# Patient Record
Sex: Female | Born: 2000 | Race: Black or African American | Hispanic: No | Marital: Single | Smoking: Current every day smoker
Health system: Southern US, Community
[De-identification: ages and names within clinical notes are randomized; demographics above are authoritative.]

## PROBLEM LIST (undated history)

## (undated) DIAGNOSIS — M238X9 Other internal derangements of unspecified knee: Secondary | ICD-10-CM

## (undated) DIAGNOSIS — R109 Unspecified abdominal pain: Secondary | ICD-10-CM

## (undated) HISTORY — DX: Unspecified abdominal pain: R10.9

## (undated) HISTORY — DX: Other internal derangements of unspecified knee: M23.8X9

---
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
## 2021-02-23 ENCOUNTER — Other Ambulatory Visit: Payer: Self-pay

## 2021-02-23 ENCOUNTER — Ambulatory Visit (HOSPITAL_COMMUNITY)
Admission: EM | Admit: 2021-02-23 | Discharge: 2021-02-23 | Disposition: A | Discharge status: Home / Self Care | Payer: Medicaid Other | Attending: Emergency Medicine | Admitting: Emergency Medicine

## 2021-02-23 ENCOUNTER — Encounter (HOSPITAL_COMMUNITY): Payer: Self-pay | Admitting: *Deleted

## 2021-02-23 DIAGNOSIS — B372 Candidiasis of skin and nail: Secondary | ICD-10-CM

## 2021-02-23 DIAGNOSIS — R198 Other specified symptoms and signs involving the digestive system and abdomen: Secondary | ICD-10-CM | POA: Diagnosis not present

## 2021-02-23 MED ORDER — NYSTATIN 100000 UNIT/GM EX CREA: TOPICAL_CREAM | CUTANEOUS | 0 refills | Status: DC

## 2021-02-23 NOTE — Discharge Instructions (Signed)
Use the nystatin cream in your bellybutton for the next few days.   Try to keep bellybutton clean and dry.  Avoid tight fitting clothing.  Follow-up with primary care or go to the Emergency Department if symptoms worsen or do not improve in the next few days.

## 2021-02-23 NOTE — ED Provider Notes (Signed)
[facility]    CSN: [id number] Arrival date & time: 02/23/21  1337      History   Chief Complaint Chief Complaint  Patient presents with  . Post-op Problem    HPI Kari Neal is a 20 y.o. female.   Patient here for evaluation of drainage from umbilicus.  Reports having gallbladder removal in February 2020.  Noticed some drainage from umbilicus yesterday and this morning.  Denies any pain.  No redness or swelling around umbilicus.  Surgical scar noted at umbilicus with good healing no signs of infection. Denies any specific alleviating or aggravating factors.  Denies any fevers, chest pain, shortness of breath, N/V/D, numbness, tingling, weakness, abdominal pain, or headaches.   ROS: As per HPI, all other pertinent ROS negative   The history is provided by the patient.    Past Medical History:  Diagnosis Date  . Abdominal pain, recurrent   . Knee crepitus     Patient Active Problem List   Diagnosis Date Noted  . Abdominal pain, recurrent     Past Surgical History:  Procedure Laterality Date  . CHOLECYSTECTOMY N/A 01/13/2019   Procedure: LAPAROSCOPIC CHOLECYSTECTOMY WITH INTRAOPERATIVE CHOLANGIOGRAM ERAS PATHWAY;  Surgeon: Ovidio Kin, MD;  Location: [facility] OR;  Service: General;  Laterality: N/A;    OB History   No obstetric history on file.      Home Medications    Prior to Admission medications   Medication Sig Start Date End Date Taking? Authorizing Provider  nystatin cream (MYCOSTATIN) Apply to affected area 2 times daily 02/23/21  Yes Ivette Loyal, NP  acetaminophen (MIDOL) 650 MG CR tablet Take 650 mg by mouth every 8 (eight) hours as needed for pain.    [provider]  baclofen (LIORESAL) 10 MG tablet Take half tablet (5mg ) at bedtime.  Call the office with update in 1-2 weeks. 06/27/20   Patel, 08/27/20 K, DO  ibuprofen (ADVIL) 600 MG tablet Take 1 tablet (600 mg total) by mouth every 6 (six) hours as needed. 09/18/20   09/20/20,  MD  ibuprofen (ADVIL,MOTRIN) 200 MG tablet Take 400 mg by mouth every 6 (six) hours as needed for moderate pain.     [provider]  mupirocin cream (BACTROBAN) 2 % Apply 1 application topically 2 (two) times daily. Patient not taking: Reported on 06/25/2020 11/01/19   14/8/20, NP    Family History Family History  Problem Relation Age of Onset  . Anxiety disorder Mother   . Miscarriages / Janace Aris Mother   . Diabetes Paternal Aunt   . Cancer Maternal Grandmother   . Breast cancer Maternal Grandmother   . Hypertension Maternal Grandmother   . Miscarriages / Stillbirths Maternal Grandmother   . Diabetes Maternal Grandfather   . Arthritis Paternal Grandmother     Social History Social History   Tobacco Use  . Smoking status: Current Every Day Smoker    Types: E-cigarettes    Start date: 04/26/2019  . Smokeless tobacco: Never Used  Vaping Use  . Vaping Use: Never used  Substance Use Topics  . Alcohol use: Yes  . Drug use: No     Allergies   Shellfish allergy   Review of Systems Review of Systems   Physical Exam Triage Vital Signs ED Triage Vitals  Enc Vitals Group     BP 02/23/21 1432 129/70     Pulse Rate 02/23/21 1432 75     Resp 02/23/21 1432 16  Temp 02/23/21 1432 98.5 F (36.9 C)     Temp Source 02/23/21 1432 Oral     SpO2 02/23/21 1432 100 %     Weight --      Height --      Head Circumference --      Peak Flow --      Pain Score 02/23/21 1433 3     Pain Loc --      Pain Edu? --      Excl. in GC? --    No data found.  Updated Vital Signs BP 129/70 (BP Location: Right Arm)   Pulse 75   Temp 98.5 F (36.9 C) (Oral)   Resp 16   LMP 02/09/2021   SpO2 100%   Visual Acuity Right Eye Distance:   Left Eye Distance:   Bilateral Distance:    Right Eye Near:   Left Eye Near:    Bilateral Near:     Physical Exam Vitals and nursing note reviewed.  Constitutional:      General: She is not in acute distress.    Appearance:  Normal appearance. She is not ill-appearing, toxic-appearing or diaphoretic.  HENT:     Head: Normocephalic and atraumatic.  Eyes:     Conjunctiva/sclera: Conjunctivae normal.  Cardiovascular:     Rate and Rhythm: Normal rate.     Pulses: Normal pulses.  Pulmonary:     Effort: Pulmonary effort is normal.  Abdominal:     General: Abdomen is flat. A surgical scar is present. There is no distension. There are no signs of injury.     Tenderness: There is no abdominal tenderness.     Comments: Surgical scar in umbilicus from gallbladder surgery in 2020, no signs or symptoms of infection  Musculoskeletal:        General: Normal range of motion.     Cervical back: Normal range of motion.  Skin:    General: Skin is warm and dry.     Findings: Rash present. Rash is scaling.     Comments: Flaking and scaly rash in umbilicus, most likely yeast  Neurological:     General: No focal deficit present.     Mental Status: She is alert and oriented to person, place, and time.  Psychiatric:        Mood and Affect: Mood normal.      UC Treatments / Results  Labs (all labs ordered are listed, but only abnormal results are displayed) Labs Reviewed - No data to display  EKG   Radiology No results found.  Procedures Procedures (including critical care time)  Medications Ordered in UC Medications - No data to display  Initial Impression / Assessment and Plan / UC Course  I have reviewed the triage vital signs and the nursing notes.  Pertinent labs & imaging results that were available during my care of the patient were reviewed by me and considered in my medical decision making (see chart for details).     Yeast dermatitis, umbilicus discharge Assessment negative for red flags or concerns including surgical infection Nystatin cream to umbilicus prescribed Keep umbilicus clean and dry and avoid tight fitting clothing. Follow-up if symptoms do not improve over the next few  days. Follow-up with primary care  Final Clinical Impressions(s) / UC Diagnoses   Final diagnoses:  Yeast dermatitis  Umbilicus discharge     Discharge Instructions     Use the nystatin cream in your bellybutton for the next few days.   Try  to keep bellybutton clean and dry.  Avoid tight fitting clothing.  Follow-up with primary care or go to the Emergency Department if symptoms worsen or do not improve in the next few days.      ED Prescriptions    Medication Sig Dispense Auth. Provider   nystatin cream (MYCOSTATIN) Apply to affected area 2 times daily 30 g Ivette Loyal, NP     PDMP not reviewed this encounter.   Ivette Loyal, NP 02/23/21 1513

## 2021-02-23 NOTE — ED Triage Notes (Signed)
Pt reports gallbladder removed in February 2022 . The drainage from incision site started yesterday. Site of drainage located in belly button site. Area appeared dry and intact while Pt was in triage room.

## 2021-03-28 ENCOUNTER — Encounter (INDEPENDENT_AMBULATORY_CARE_PROVIDER_SITE_OTHER): Payer: Self-pay

## 2021-04-02 ENCOUNTER — Inpatient Hospital Stay (HOSPITAL_COMMUNITY)
Admission: AD | Admit: 2021-04-02 | Discharge: 2021-04-02 | Disposition: A | Discharge status: Home / Self Care | Payer: Medicaid Other | Attending: Obstetrics and Gynecology | Admitting: Obstetrics and Gynecology

## 2021-04-02 ENCOUNTER — Other Ambulatory Visit: Payer: Self-pay

## 2021-04-02 ENCOUNTER — Encounter (HOSPITAL_COMMUNITY): Payer: Self-pay | Admitting: Obstetrics and Gynecology

## 2021-04-02 ENCOUNTER — Inpatient Hospital Stay (HOSPITAL_COMMUNITY): Payer: Medicaid Other

## 2021-04-02 DIAGNOSIS — Z3A01 Less than 8 weeks gestation of pregnancy: Secondary | ICD-10-CM | POA: Insufficient documentation

## 2021-04-02 DIAGNOSIS — O219 Vomiting of pregnancy, unspecified: Secondary | ICD-10-CM | POA: Diagnosis not present

## 2021-04-02 DIAGNOSIS — F17210 Nicotine dependence, cigarettes, uncomplicated: Secondary | ICD-10-CM | POA: Insufficient documentation

## 2021-04-02 DIAGNOSIS — N8312 Corpus luteum cyst of left ovary: Secondary | ICD-10-CM | POA: Diagnosis not present

## 2021-04-02 DIAGNOSIS — O26891 Other specified pregnancy related conditions, first trimester: Secondary | ICD-10-CM | POA: Insufficient documentation

## 2021-04-02 DIAGNOSIS — Z3490 Encounter for supervision of normal pregnancy, unspecified, unspecified trimester: Secondary | ICD-10-CM

## 2021-04-02 DIAGNOSIS — R1010 Upper abdominal pain, unspecified: Secondary | ICD-10-CM | POA: Insufficient documentation

## 2021-04-02 DIAGNOSIS — R109 Unspecified abdominal pain: Secondary | ICD-10-CM

## 2021-04-02 DIAGNOSIS — O99331 Smoking (tobacco) complicating pregnancy, first trimester: Secondary | ICD-10-CM | POA: Diagnosis not present

## 2021-04-02 DIAGNOSIS — O26899 Other specified pregnancy related conditions, unspecified trimester: Secondary | ICD-10-CM

## 2021-04-02 LAB — WET PREP, GENITAL
Clue Cells Wet Prep HPF POC: NONE SEEN
Sperm: NONE SEEN
Trich, Wet Prep: NONE SEEN
Yeast Wet Prep HPF POC: NONE SEEN

## 2021-04-02 LAB — CBC
HCT: 29.1 % — ABNORMAL LOW (ref 36.0–46.0)
Hemoglobin: 10.7 g/dL — ABNORMAL LOW (ref 12.0–15.0)
MCH: 33.2 pg (ref 26.0–34.0)
MCHC: 36.8 g/dL — ABNORMAL HIGH (ref 30.0–36.0)
MCV: 90.4 fL (ref 80.0–100.0)
Platelets: 320 10*3/uL (ref 150–400)
RBC: 3.22 MIL/uL — ABNORMAL LOW (ref 3.87–5.11)
RDW: 16.3 % — ABNORMAL HIGH (ref 11.5–15.5)
WBC: 14.5 10*3/uL — ABNORMAL HIGH (ref 4.0–10.5)
nRBC: 0 % (ref 0.0–0.2)

## 2021-04-02 LAB — URINALYSIS, ROUTINE W REFLEX MICROSCOPIC
Bilirubin Urine: NEGATIVE
Glucose, UA: NEGATIVE mg/dL
Hgb urine dipstick: NEGATIVE
Ketones, ur: 20 mg/dL — AB
Nitrite: NEGATIVE
Protein, ur: NEGATIVE mg/dL
Specific Gravity, Urine: 1.016 (ref 1.005–1.030)
pH: 7 (ref 5.0–8.0)

## 2021-04-02 LAB — COMPREHENSIVE METABOLIC PANEL
ALT: 12 U/L (ref 0–44)
AST: 18 U/L (ref 15–41)
Albumin: 4.1 g/dL (ref 3.5–5.0)
Alkaline Phosphatase: 28 U/L — ABNORMAL LOW (ref 38–126)
Anion gap: 8 (ref 5–15)
BUN: <5 mg/dL — ABNORMAL LOW (ref 6–20)
CO2: 22 mmol/L (ref 22–32)
Calcium: 8.9 mg/dL (ref 8.9–10.3)
Chloride: 103 mmol/L (ref 98–111)
Creatinine, Ser: 0.62 mg/dL (ref 0.44–1.00)
GFR, Estimated: >60 mL/min (ref >60)
Glucose, Bld: 78 mg/dL (ref 70–99)
Potassium: 3.7 mmol/L (ref 3.5–5.1)
Sodium: 133 mmol/L — ABNORMAL LOW (ref 135–145)
Total Bilirubin: 3.2 mg/dL — ABNORMAL HIGH (ref 0.3–1.2)
Total Protein: 6.8 g/dL (ref 6.5–8.1)

## 2021-04-02 LAB — ABO/RH: ABO/RH(D): O POS

## 2021-04-02 LAB — POCT PREGNANCY, URINE: Preg Test, Ur: POSITIVE — AB

## 2021-04-02 LAB — HCG, QUANTITATIVE, PREGNANCY: hCG, Beta Chain, Quant, S: 103941 m[IU]/mL — ABNORMAL HIGH (ref <5)

## 2021-04-02 MED ORDER — DOXYLAMINE-PYRIDOXINE 10-10 MG PO TBEC: 2.0000 | DELAYED_RELEASE_TABLET | Freq: Every day | ORAL | 2 refills | Status: DC

## 2021-04-02 NOTE — MAU Note (Signed)
Donia Ast NP in Family Rm to discuss test results and d/c plan with pt. Written and verbal d/c instructions given and understanding voiced.

## 2021-04-02 NOTE — MAU Provider Note (Signed)
History     CSN: [id number]  Arrival date and time: 04/02/21 1526   None     Chief Complaint  Patient presents with  . Abdominal Pain  . Emesis  . Nausea   [name] is a 20 y.o. G1P0 at 103w2d who presents to MAU for upper abdominal pain. Patient reports the pain began this afternoon and is intermittent and not present at this time. Patient identifies epigastric region as site of pain. Patient also endorses 2 episodes of vomiting prior to coming to MAU.  Pt denies VB, LOF, ctx, decreased FM, vaginal discharge/odor/itching. Pt denies constipation, diarrhea, or urinary problems. Pt denies fever, chills, fatigue, sweating or changes in appetite. Pt denies SOB or chest pain. Pt denies dizziness, HA, light-headedness, weakness.  Problems this pregnancy include: pt has not yet been seen. Allergies? Shellfish Current medications/supplements? none Prenatal care provider? [facility], [facility] appt 04/15/2021   OB History    Gravida  1   Para      Term      Preterm      AB      Living        SAB      IAB      Ectopic      Multiple      Live Births              Past Medical History:  Diagnosis Date  . Abdominal pain, recurrent   . Knee crepitus     Past Surgical History:  Procedure Laterality Date  . CHOLECYSTECTOMY N/A 01/13/2019   Procedure: LAPAROSCOPIC CHOLECYSTECTOMY WITH INTRAOPERATIVE CHOLANGIOGRAM ERAS PATHWAY;  Surgeon: Ovidio Kin, MD;  [facility];  Service: General;  Laterality: N/A;    Family History  Problem Relation Age of Onset  . Anxiety disorder Mother   . Miscarriages / [place] Mother   . Diabetes Paternal Aunt   . Cancer Maternal Grandmother   . Breast cancer Maternal Grandmother   . Hypertension Maternal Grandmother   . Miscarriages / Stillbirths Maternal Grandmother   . Diabetes Maternal Grandfather   . Arthritis Paternal Grandmother     Social History   Tobacco Use  . Smoking status: Current Every  Day Smoker    Types: E-cigarettes    Start date: 04/26/2019  . Smokeless tobacco: Never Used  Vaping Use  . Vaping Use: Never used  Substance Use Topics  . Alcohol use: Yes  . Drug use: No    Allergies:  Allergies  Allergen Reactions  . Shellfish Allergy Hives    Medications Prior to Admission  Medication Sig Dispense Refill Last Dose  . acetaminophen (MIDOL) 650 MG CR tablet Take 650 mg by mouth every 8 (eight) hours as needed for pain.     . baclofen (LIORESAL) 10 MG tablet Take half tablet (5mg ) at bedtime.  Call the office with update in 1-2 weeks. 30 each 0   . ibuprofen (ADVIL) 600 MG tablet Take 1 tablet (600 mg total) by mouth every 6 (six) hours as needed. 30 tablet 0   . ibuprofen (ADVIL,MOTRIN) 200 MG tablet Take 400 mg by mouth every 6 (six) hours as needed for moderate pain.      . mupirocin cream (BACTROBAN) 2 % Apply 1 application topically 2 (two) times daily. (Patient not taking: Reported on 06/25/2020) 15 g 0   . nystatin cream (MYCOSTATIN) Apply to affected area 2 times daily 30 g 0     Review of Systems  Constitutional: Negative for chills, diaphoresis, fatigue and fever.  Eyes: Negative for visual disturbance.  Respiratory: Negative for shortness of breath.   Cardiovascular: Negative for chest pain.  Gastrointestinal: Positive for abdominal pain, nausea and vomiting. Negative for constipation and diarrhea.  Genitourinary: Negative for dysuria, flank pain, frequency, pelvic pain, urgency, vaginal bleeding and vaginal discharge.  Neurological: Negative for dizziness, weakness, light-headedness and headaches.   Physical Exam   Blood pressure 121/68, pulse 86, temperature 98.3 F (36.8 C), temperature source Oral, resp. rate 18, height 5' (1.524 m), weight 83.1 kg, last menstrual period 02/10/2021, SpO2 100 %.  Patient Vitals for the past 24 hrs:  BP Temp Temp src Pulse Resp SpO2 Height Weight  04/02/21 1555 121/68 98.3 F (36.8 C) Oral 86 18 100 % -- --   04/02/21 1552 -- -- -- -- -- -- 5' (1.524 m) 83.1 kg   Physical Exam Vitals and nursing note reviewed.  Constitutional:      General: She is not in acute distress.    Appearance: Normal appearance. She is not ill-appearing, toxic-appearing or diaphoretic.  HENT:     Head: Normocephalic and atraumatic.  Pulmonary:     Effort: Pulmonary effort is normal.  Neurological:     Mental Status: She is alert and oriented to person, place, and time.  Psychiatric:        Mood and Affect: Mood normal.        Behavior: Behavior normal.        Thought Content: Thought content normal.        Judgment: Judgment normal.    Results for orders placed or performed during the hospital encounter of 04/02/21 (from the past 24 hour(s))  Urinalysis, Routine w reflex microscopic Urine, Clean Catch     Status: Abnormal   Collection Time: 04/02/21  4:04 PM  Result Value Ref Range   Color, Urine YELLOW YELLOW   APPearance CLEAR CLEAR   Specific Gravity, Urine 1.016 1.005 - 1.030   pH 7.0 5.0 - 8.0   Glucose, UA NEGATIVE NEGATIVE mg/dL   Hgb urine dipstick NEGATIVE NEGATIVE   Bilirubin Urine NEGATIVE NEGATIVE   Ketones, ur 20 (A) NEGATIVE mg/dL   Protein, ur NEGATIVE NEGATIVE mg/dL   Nitrite NEGATIVE NEGATIVE   Leukocytes,Ua SMALL (A) NEGATIVE   RBC / HPF 0-5 0 - 5 RBC/hpf   WBC, UA 0-5 0 - 5 WBC/hpf   Bacteria, UA RARE (A) NONE SEEN   Squamous Epithelial / LPF 0-5 0 - 5   Mucus PRESENT   Pregnancy, urine POC     Status: Abnormal   Collection Time: 04/02/21  5:39 PM  Result Value Ref Range   Preg Test, Ur POSITIVE (A) NEGATIVE  Wet prep, genital     Status: Abnormal   Collection Time: 04/02/21  5:54 PM   Specimen: PATH Cytology Cervicovaginal Ancillary Only  Result Value Ref Range   Yeast Wet Prep HPF POC NONE SEEN NONE SEEN   Trich, Wet Prep NONE SEEN NONE SEEN   Clue Cells Wet Prep HPF POC NONE SEEN NONE SEEN   WBC, Wet Prep HPF POC MANY (A) NONE SEEN   Sperm NONE SEEN   CBC     Status:  Abnormal   Collection Time: 04/02/21  6:00 PM  Result Value Ref Range   WBC 14.5 (H) 4.0 - 10.5 K/uL   RBC 3.22 (L) 3.87 - 5.11 MIL/uL   Hemoglobin 10.7 (L) 12.0 - 15.0 g/dL   HCT 53.6 (L)  36.0 - 46.0 %   MCV 90.4 80.0 - 100.0 fL   MCH 33.2 26.0 - 34.0 pg   MCHC 36.8 (H) 30.0 - 36.0 g/dL   RDW 23.5 (H) 57.3 - 22.0 %   Platelets 320 150 - 400 K/uL   nRBC 0.0 0.0 - 0.2 %  Comprehensive metabolic panel     Status: Abnormal   Collection Time: 04/02/21  6:00 PM  Result Value Ref Range   Sodium 133 (L) 135 - 145 mmol/L   Potassium 3.7 3.5 - 5.1 mmol/L   Chloride 103 98 - 111 mmol/L   CO2 22 22 - 32 mmol/L   Glucose, Bld 78 70 - 99 mg/dL   BUN <5 (L) 6 - 20 mg/dL   Creatinine, Ser 2.54 0.44 - 1.00 mg/dL   Calcium 8.9 8.9 - 27.0 mg/dL   Total Protein 6.8 6.5 - 8.1 g/dL   Albumin 4.1 3.5 - 5.0 g/dL   AST 18 15 - 41 U/L   ALT 12 0 - 44 U/L   Alkaline Phosphatase 28 (L) 38 - 126 U/L   Total Bilirubin 3.2 (H) 0.3 - 1.2 mg/dL   GFR, Estimated >62 >37 mL/min   Anion gap 8 5 - 15  hCG, quantitative, pregnancy     Status: Abnormal   Collection Time: 04/02/21  6:00 PM  Result Value Ref Range   hCG, Beta Chain, Quant, S 103,941 (H) <5 mIU/mL  ABO/Rh     Status: None   Collection Time: 04/02/21  6:00 PM  Result Value Ref Range   ABO/RH(D) O POS    No rh immune globuloin      NOT A RH IMMUNE GLOBULIN CANDIDATE, PT RH POSITIVE Performed at [facility], [address] [place], [place] [address]    US OB LESS THAN 14 WEEKS WITH OB TRANSVAGINAL  Result Date: 04/02/2021 CLINICAL DATA:  Initial evaluation for acute abdominal pain, early pregnancy. EXAM: OBSTETRIC <14 WK [place] AND TRANSVAGINAL OB US TECHNIQUE: Both transabdominal and transvaginal ultrasound examinations were performed for complete evaluation of the gestation as well as the maternal uterus, adnexal regions, and pelvic cul-de-sac. Transvaginal technique was performed to assess early pregnancy. COMPARISON:  None. FINDINGS:  Intrauterine gestational sac: Single Yolk sac:  Present Embryo:  Present Cardiac Activity: Present Heart Rate: 159 bpm CRL: 13.5 mm   7 w   4 d                  [place] EDC: 11/15/2021 Subchorionic hemorrhage:  None visualized. Maternal uterus/adnexae: Ovaries within normal limits. Degenerating corpus luteal cyst noted on the left. Associated small volume free fluid. No adnexal mass. IMPRESSION: 1. Single viable intrauterine pregnancy as above, estimated gestational age [redacted] weeks and 4 days by crown-rump length, with ultrasound EDC of 11/15/2021. No complication. 2. Degenerating left ovarian corpus luteal cyst with associated small volume free fluid within the pelvis. 3. No other acute maternal uterine or adnexal abnormality. Electronically Signed   By: Rise Mu M.D.   On: 04/02/2021 20:18    MAU Course  Procedures  MDM -r/o ectopic -UA: 20ketones/sm leuks/rare bacteria, sending urine for culture -CBC: no abnormalities requiring treatment -CMP: bilirubin 3.2 -[place]: single IUP, +embryo, FHR 159, [redacted]w[redacted]d -hCG: 517,616 -ABO: O Positive -WetPrep: WNL -GC/CT collected -pt discharged to home in stable condition  Orders Placed This Encounter  Procedures  . Wet prep, genital    Standing Status:   Standing    Number of Occurrences:  1  . Culture, OB Urine    Standing Status:   Standing    Number of Occurrences:   1  . US OB LESS THAN 14 WEEKS WITH OB TRANSVAGINAL    Standing Status:   Standing    Number of Occurrences:   1    Order Specific Question:   Symptom/Reason for Exam    Answer:   Abdominal pain in pregnancy [[id number]]  . Urinalysis, Routine w reflex microscopic Urine, Clean Catch    Standing Status:   Standing    Number of Occurrences:   1  . CBC    Standing Status:   Standing    Number of Occurrences:   1  . Comprehensive metabolic panel    Standing Status:   Standing    Number of Occurrences:   1  . hCG, quantitative, pregnancy    Standing Status:   Standing    Number of  Occurrences:   1  . Pregnancy, urine POC    Standing Status:   Standing    Number of Occurrences:   1  . ABO/Rh    Standing Status:   Standing    Number of Occurrences:   1  . Discharge patient    Order Specific Question:   Discharge disposition    Answer:   01-Home or Self Care [1]    Order Specific Question:   Discharge patient date    Answer:   04/02/2021   Meds ordered this encounter  Medications  . Doxylamine-Pyridoxine (DICLEGIS) 10-10 MG TBEC    Sig: Take 2 tablets by mouth at bedtime. Start by taking two tablets at bedtime on day 1 and 2; if symptoms persist, take 1 tablet in morning and 2 tablets at bedtime on day 3; if symptoms persist, may increase to 1 tablet in morning, 1 tablet mid-afternoon, and 2 tablets at bedtime on day 4 (maximum: doxylamine 40 mg/pyridoxine 40 mg (4 tablets) per day).    Dispense:  60 tablet    Refill:  2    Order Specific Question:   Supervising Provider    Answer:   Alysia Penna, MICHAEL L [[id number]]   Assessment and Plan   1. Intrauterine pregnancy   2. Abdominal pain in pregnancy   3. [redacted] weeks gestation of pregnancy   4. Nausea and vomiting in pregnancy     Allergies as of 04/02/2021      Reactions   Shellfish Allergy Hives      Medication List    STOP taking these medications   baclofen 10 MG tablet Commonly known as: LIORESAL   ibuprofen 200 MG tablet Commonly known as: ADVIL   ibuprofen 600 MG tablet Commonly known as: ADVIL     TAKE these medications   Doxylamine-Pyridoxine 10-10 MG Tbec Commonly known as: Diclegis Take 2 tablets by mouth at bedtime. Start by taking two tablets at bedtime on day 1 and 2; if symptoms persist, take 1 tablet in morning and 2 tablets at bedtime on day 3; if symptoms persist, may increase to 1 tablet in morning, 1 tablet mid-afternoon, and 2 tablets at bedtime on day 4 (maximum: doxylamine 40 mg/pyridoxine 40 mg (4 tablets) per day).   Midol 650 MG CR tablet Generic drug: acetaminophen Take 650 mg by  mouth every 8 (eight) hours as needed for pain.   mupirocin cream 2 % Commonly known as: BACTROBAN Apply 1 application topically 2 (two) times daily.   nystatin cream Commonly known as: MYCOSTATIN Apply to affected  area 2 times daily      -will call with culture results, if positive -safe meds in pregnancy list given -RX Diclegis -return MAU precautions given -pt discharged to home in stable condition  Joni Reiningicole E Maclovia Uher 04/02/2021, 8:22 PM

## 2021-04-02 NOTE — MAU Note (Addendum)
Presents with c/o sharp abdominal pain above the umbilicus that began today @ noon.  Denies VB.  Also reports 2 episodes of N/V.  LMP 02/10/2021.

## 2021-04-02 NOTE — Discharge Instructions (Signed)
Safe Medications in Pregnancy    Acne: Benzoyl Peroxide Salicylic Acid  Backache/Headache: Tylenol: 2 regular strength every 4 hours OR              2 Extra strength every 6 hours  Colds/Coughs/Allergies: Benadryl (alcohol free) 25 mg every 6 hours as needed Breath right strips Claritin Cepacol throat lozenges Chloraseptic throat spray Cold-Eeze- up to three times per day Cough drops, alcohol free Flonase (by prescription only) Guaifenesin Mucinex Robitussin DM (plain only, alcohol free) Saline nasal spray/drops Sudafed (pseudoephedrine) & Actifed ** use only after [redacted] weeks gestation and if you do not have high blood pressure Tylenol Vicks Vaporub Zinc lozenges Zyrtec   Constipation: Colace Ducolax suppositories Fleet enema Glycerin suppositories Metamucil Milk of magnesia Miralax Senokot Smooth move tea  Diarrhea: Kaopectate Imodium A-D  *NO pepto Bismol  Hemorrhoids: Anusol Anusol HC Preparation H Tucks  Indigestion: Tums Maalox Mylanta Zantac  Pepcid  Insomnia: Benadryl (alcohol free) 25mg  every 6 hours as needed Tylenol PM Unisom, no Gelcaps  Leg Cramps: Tums MagGel  Nausea/Vomiting:  Bonine Dramamine Emetrol Ginger extract Sea bands Meclizine  Nausea medication to take during pregnancy:  Unisom (doxylamine succinate 25 mg tablets) Take one tablet daily at bedtime. If symptoms are not adequately controlled, the dose can be increased to a maximum recommended dose of two tablets daily (1/2 tablet in the morning, 1/2 tablet mid-afternoon and one at bedtime). Vitamin B6 100mg  tablets. Take one tablet twice a day (up to 200 mg per day).  Skin Rashes: Aveeno products Benadryl cream or 25mg  every 6 hours as needed Calamine Lotion 1% cortisone cream  Yeast infection: Gyne-lotrimin 7 Monistat 7   **If taking multiple medications, please check labels to avoid duplicating the same active ingredients **take  medication as directed on the label ** Do not exceed 4000 mg of tylenol in 24 hours **Do not take medications that contain aspirin or ibuprofen           Morning Sickness  Morning sickness is when a woman feels nauseous during pregnancy. This nauseous feeling may or may not come with vomiting. It often occurs in the morning, but it can be a problem at any time of day. Morning sickness is most common during the first trimester. In some cases, it may continue throughout pregnancy. Although morning sickness is unpleasant, it is usually harmless unless the woman develops severe and continual vomiting (hyperemesis gravidarum), a condition that requires more intense treatment. What are the causes? The exact cause of this condition is not known, but it seems to be related to normal hormonal changes that occur in pregnancy. What increases the risk? You are more likely to develop this condition if:  You experienced nausea or vomiting before your pregnancy.  You had morning sickness during a previous pregnancy.  You are pregnant with more than one baby, such as twins. What are the signs or symptoms? Symptoms of this condition include:  Nausea.  Vomiting. How is this diagnosed? This condition is usually diagnosed based on your signs and symptoms. How is this treated? In many cases, treatment is not needed for this condition. Making some changes to what you eat may help to control symptoms. Your health care provider may also prescribe or recommend:  Vitamin B6 supplements.  Anti-nausea medicines.  Ginger. Follow these instructions at home: Medicines  Take over-the-counter and prescription medicines only as told by your health care provider. Do not use any prescription, over-the-counter, or herbal medicines for morning sickness without first  talking with your health care provider.  Take multivitamins before getting pregnant. This can prevent or decrease the severity of morning  sickness in most women. Eating and drinking  Eat a piece of dry toast or crackers before getting out of bed in the morning.  Eat 5 or 6 small meals a day.  Eat dry and bland foods, such as rice or a baked potato. Foods that are high in carbohydrates are often helpful.  Avoid greasy, fatty, and spicy foods.  Have someone cook for you if the smell of any food causes nausea and vomiting.  If you feel nauseous after taking prenatal vitamins, take the vitamins at night or with a snack.  Eat a protein snack between meals if you are hungry. Nuts, yogurt, and cheese are good options.  Drink fluids throughout the day.  Try ginger ale made with real ginger, ginger tea made from fresh grated ginger, or ginger candies. General instructions  Do not use any products that contain nicotine or tobacco. These products include cigarettes, chewing tobacco, and vaping devices, such as e-cigarettes. If you need help quitting, ask your health care provider.  Get an air purifier to keep the air in your house free of odors.  Get plenty of fresh air.  Try to avoid odors that trigger your nausea.  Consider trying these methods to help relieve symptoms: ? Wearing an acupressure wristband. These wristbands are often worn for seasickness. ? Acupuncture. Contact a health care provider if:  Your home remedies are not working and you need medicine.  You feel dizzy or light-headed.  You are losing weight. Get help right away if:  You have persistent and uncontrolled nausea and vomiting.  You faint.  You have severe pain in your abdomen. Summary  Morning sickness is when a woman feels nauseous during pregnancy. This nauseous feeling may or may not come with vomiting.  Morning sickness is most common during the first trimester.  It often occurs in the morning, but it can be a problem at any time of day.  In many cases, treatment is not needed for this condition. Making some changes to what you eat  may help to control symptoms. This information is not intended to replace advice given to you by your health care provider. Make sure you discuss any questions you have with your health care provider. Document Revised: 06/25/2020 Document Reviewed: 06/04/2020 Elsevier Patient Education  2021 Elsevier Inc.        Hyperemesis Gravidarum Hyperemesis gravidarum is a severe form of nausea and vomiting that happens during pregnancy. Hyperemesis is worse than morning sickness. It may cause you to have nausea or vomiting all day for many days. It may keep you from eating and drinking enough food and liquids, which can lead to dehydration, malnutrition, and weight loss. Hyperemesis usually occurs during the first half (the first 20 weeks) of pregnancy. It often goes away once a woman is in her second half of pregnancy. However, sometimes hyperemesis continues through an entire pregnancy. What are the causes? The cause of this condition is not known. It may be associated with:  Changes in hormones in the body during pregnancy.  Changes in the gastrointestinal system.  Genetic or inherited conditions. What are the signs or symptoms? Symptoms of this condition include:  Severe nausea and vomiting that does not go away.  Problems keeping food down.  Weight loss.  Loss of body fluid (dehydration).  Loss of appetite. You may have no desire to eat or  you may not like the food you have previously enjoyed. How is this diagnosed? This condition may be diagnosed based on your medical history, your symptoms, and a physical exam. You may also have other tests, including:  Blood tests.  Urine tests.  Blood pressure tests.  Ultrasound to look for problems with the placenta or to check if you are pregnant with more than one baby. How is this treated? This condition is managed by controlling symptoms. This may include:  Following an eating plan. This can help to lessen nausea and  vomiting.  Treatments that do not use medicine. These include acupressure bracelets, hypnosis, and eating or drinking foods or fluids that contain ginger, ginger ale, or ginger tea.  Taking prescription medicine or over-the-counter medicine as told by your health care provider.  Continuing to take prenatal vitamins. You may need to change what kind you take and when you take them. Follow your health care provider's instructions about prenatal vitamins. An eating plan and medicines are often used together to help control symptoms. If medicines do not help relieve nausea and vomiting, you may need to receive fluids through an IV at the hospital. Follow these instructions at home: To help relieve your symptoms, listen to your body. Everyone is different and has different preferences. Find what works best for you. Here are some things you can try to help relieve your symptoms: Meals and snacks  Eat 5-6 small meals daily instead of 3 large meals. Eating small meals and snacks can help you avoid an empty stomach.  Before getting out of bed, eat a couple of crackers to avoid moving around on an empty stomach.  Eat a protein-rich snack before bed. Examples include cheese and crackers, or a peanut butter sandwich made with 1 slice of whole-wheat bread and 1 tsp (5 g) of peanut butter.  Eat and drink slowly.  Try eating starchy foods as these are usually tolerated well. Examples include cereal, toast, bread, potatoes, pasta, rice, and pretzels.  Eat at least one serving of protein with your meals and snacks. Protein options include lean meats, poultry, seafood, beans, nuts, nut butters, eggs, cheese, and yogurt.  Eat or suck on things that have ginger in them. It may help to relieve nausea. Add  tsp (0.44 g) ground ginger to hot tea, or choose ginger tea.   Fluids It is important to stay hydrated. Try to:  Drink small amounts of fluids often.  Drink fluids 30 minutes before or after a meal to  help lessen the feeling of a full stomach.  Drink 100% fruit juice or an electrolyte drink. An electrolyte drink contains sodium, potassium, and chloride.  Drink fluids that are cold, clear, and carbonated or sour. These include lemonade, ginger ale, lemon-lime soda, ice water, and sparkling water. Things to avoid Avoid the following:  Eating foods that trigger your symptoms. These may include spicy foods, coffee, high-fat foods, very sweet foods, and acidic foods.  Drinking more than 1 cup of fluid at a time.  Skipping meals. Nausea can be more intense on an empty stomach. If you cannot tolerate food, do not force it. Try sucking on ice chips or other frozen items and make up for missed calories later.  Lying down within 2 hours after eating.  Being exposed to environmental triggers. These may include food smells, smoky rooms, closed spaces, rooms with strong smells, warm or humid places, overly loud and noisy rooms, and rooms with motion or flickering lights. Try eating meals  in a well-ventilated area that is free of strong smells.  Making quick and sudden changes in your movement.  Taking iron pills and multivitamins that contain iron. If you take prescription iron pills, do not stop taking them unless your health care provider approves.  Preparing food. The smell of food can spoil your appetite or trigger nausea. General instructions  Brush your teeth or use a mouth rinse after meals.  Take over-the-counter and prescription medicines only as told by your health care provider.  Follow instructions from your health care provider about eating or drinking restrictions.  Talk with your health care provider about starting a supplement of vitamin B6.  Continue to take your prenatal vitamins as told by your health care provider. If you are having trouble taking your prenatal vitamins, talk with your health care provider about other options.  Keep all follow-up visits. This is  important. Follow-up visits include prenatal visits. Contact a health care provider if:  You have pain in your abdomen.  You have a severe headache.  You have vision problems.  You are losing weight.  You feel weak or dizzy.  You cannot eat or drink without vomiting, especially if this goes on for a full day. Get help right away if:  You cannot drink fluids without vomiting.  You vomit blood.  You have constant nausea and vomiting.  You are very weak.  You faint.  You have a fever and your symptoms suddenly get worse. Summary  Hyperemesis gravidarum is a severe form of nausea and vomiting that happens during pregnancy.  Making some changes to your eating habits may help relieve nausea and vomiting.  This condition may be managed with lifestyle changes and medicines as prescribed by your health care provider.  If medicines do not help relieve nausea and vomiting, you may need to receive fluids through an IV at the hospital. This information is not intended to replace advice given to you by your health care provider. Make sure you discuss any questions you have with your health care provider. Document Revised: 06/04/2020 Document Reviewed: 06/04/2020 Elsevier Patient Education  2021 Elsevier Inc.        Obstetrics: Normal and Problem Pregnancies (7th ed., pp. 102-121). [place], [place]: Elsevier."> Textbook of Family Medicine (9th ed., pp. [id number]). [place], [place]: [employer].">  First Trimester of Pregnancy  The first trimester of pregnancy starts on the first day of your last menstrual period until the end of week 12. This is months 1 through 3 of pregnancy. A week after a sperm fertilizes an egg, the egg will implant into the wall of the uterus and begin to develop into a baby. By the end of 12 weeks, all the baby's organs will be formed and the baby will be 2-3 inches in size. Body changes during your first trimester Your body goes through many changes  during pregnancy. The changes vary and generally return to normal after your baby is born. Physical changes  You may gain or lose weight.  Your breasts may begin to grow larger and become tender. The tissue that surrounds your nipples (areola) may become darker.  Dark spots or blotches (chloasma or mask of pregnancy) may develop on your face.  You may have changes in your hair. These can include thickening or thinning of your hair or changes in texture. Health changes  You may feel nauseous, and you may vomit.  You may have heartburn.  You may develop headaches.  You may develop constipation.  Your  gums may bleed and may be sensitive to brushing and flossing. Other changes  You may tire easily.  You may urinate more often.  Your menstrual periods will stop.  You may have a loss of appetite.  You may develop cravings for certain kinds of food.  You may have changes in your emotions from day to day.  You may have more vivid and strange dreams. Follow these instructions at home: Medicines  Follow your health care provider's instructions regarding medicine use. Specific medicines may be either safe or unsafe to take during pregnancy. Do not take any medicines unless told to by your health care provider.  Take a prenatal vitamin that contains at least 600 micrograms (mcg) of folic acid. Eating and drinking  Eat a healthy diet that includes fresh fruits and vegetables, whole grains, good sources of protein such as meat, eggs, or tofu, and low-fat dairy products.  Avoid raw meat and unpasteurized juice, milk, and cheese. These carry germs that can harm you and your baby.  If you feel nauseous or you vomit: ? Eat 4 or 5 small meals a day instead of 3 large meals. ? Try eating a few soda crackers. ? Drink liquids between meals instead of during meals.  You may need to take these actions to prevent or treat constipation: ? Drink enough fluid to keep your urine pale  yellow. ? Eat foods that are high in fiber, such as beans, whole grains, and fresh fruits and vegetables. ? Limit foods that are high in fat and processed sugars, such as fried or sweet foods. Activity  Exercise only as directed by your health care provider. Most people can continue their usual exercise routine during pregnancy. Try to exercise for 30 minutes at least 5 days a week.  Stop exercising if you develop pain or cramping in the lower abdomen or lower back.  Avoid exercising if it is very hot or humid or if you are at high altitude.  Avoid heavy lifting.  If you choose to, you may have sex unless your health care provider tells you not to. Relieving pain and discomfort  Wear a good support bra to relieve breast tenderness.  Rest with your legs elevated if you have leg cramps or low back pain.  If you develop bulging veins (varicose veins) in your legs: ? Wear support hose as told by your health care provider. ? Elevate your feet for 15 minutes, 3-4 times a day. ? Limit salt in your diet. Safety  Wear your seat belt at all times when driving or riding in a car.  Talk with your health care provider if someone is verbally or physically abusive to you.  Talk with your health care provider if you are feeling sad or have thoughts of hurting yourself. Lifestyle  Do not use hot tubs, steam rooms, or saunas.  Do not douche. Do not use tampons or scented sanitary pads.  Do not use herbal remedies, alcohol, illegal drugs, or medicines that are not approved by your health care provider. Chemicals in these products can harm your baby.  Do not use any products that contain nicotine or tobacco, such as cigarettes, e-cigarettes, and chewing tobacco. If you need help quitting, ask your health care provider.  Avoid cat litter boxes and soil used by cats. These carry germs that can cause birth defects in the baby and possibly loss of the unborn baby (fetus) by miscarriage or  stillbirth. General instructions  During routine prenatal visits in the  first trimester, your health care provider will do a physical exam, perform necessary tests, and ask you how things are going. Keep all follow-up visits. This is important.  Ask for help if you have counseling or nutritional needs during pregnancy. Your health care provider can offer advice or refer you to specialists for help with various needs.  Schedule a dentist appointment. At home, brush your teeth with a soft toothbrush. Floss gently.  Write down your questions. Take them to your prenatal visits. Where to find more information  American Pregnancy Association: americanpregnancy.org  [employer]: https://www.todd-brady.net/  Office on [employer] [facility]: [employer] Contact a health care provider if you have:  Dizziness.  A fever.  Mild pelvic cramps, pelvic pressure, or nagging pain in the abdominal area.  Nausea, vomiting, or diarrhea that lasts for 24 hours or longer.  A bad-smelling vaginal discharge.  Pain when you urinate.  Known exposure to a contagious illness, such as chickenpox, measles, Zika virus, HIV, or hepatitis. Get help right away if you have:  Spotting or bleeding from your vagina.  Severe abdominal cramping or pain.  Shortness of breath or chest pain.  Any kind of trauma, such as from a fall or a car crash.  New or increased pain, swelling, or redness in an arm or leg. Summary  The first trimester of pregnancy starts on the first day of your last menstrual period until the end of week 12 (months 1 through 3).  Eating 4 or 5 small meals a day rather than 3 large meals may help to relieve nausea and vomiting.  Do not use any products that contain nicotine or tobacco, such as cigarettes, e-cigarettes, and chewing tobacco. If you need help quitting, ask your health care provider.  Keep all follow-up visits. This is  important. This information is not intended to replace advice given to you by your health care provider. Make sure you discuss any questions you have with your health care provider. Document Revised: 04/18/2020 Document Reviewed: 02/23/2020 Elsevier Patient Education  2021 Elsevier Inc.        Second Trimester of Pregnancy  The second trimester of pregnancy is from week 13 through week 27. This is months 4 through 6 of pregnancy. The second trimester is often a time when you feel your best. Your body has adjusted to being pregnant, and you begin to feel better physically. During the second trimester:  Morning sickness has lessened or stopped completely.  You may have more energy.  You may have an increase in appetite. The second trimester is also a time when the unborn baby (fetus) is growing rapidly. At the end of the sixth month, the fetus may be up to 12 inches long and weigh about 1 pounds. You will likely begin to feel the baby move (quickening) between 16 and 20 weeks of pregnancy. Body changes during your second trimester Your body continues to go through many changes during your second trimester. The changes vary and generally return to normal after the baby is born. Physical changes  Your weight will continue to increase. You will notice your lower abdomen bulging out.  You may begin to get stretch marks on your hips, abdomen, and breasts.  Your breasts will continue to grow and to become tender.  Dark spots or blotches (chloasma or mask of pregnancy) may develop on your face.  A dark line from your belly button to the pubic area (linea nigra) may appear.  You may have changes  in your hair. These can include thickening of your hair, rapid growth, and changes in texture. Some people also have hair loss during or after pregnancy, or hair that feels dry or thin. Health changes  You may develop headaches.  You may have heartburn.  You may develop constipation.  You  may develop hemorrhoids or swollen, bulging veins (varicose veins).  Your gums may bleed and may be sensitive to brushing and flossing.  You may urinate more often because the fetus is pressing on your bladder.  You may have back pain. This is caused by: ? Weight gain. ? Pregnancy hormones that are relaxing the joints in your pelvis. ? A shift in weight and the muscles that support your balance. Follow these instructions at home: Medicines  Follow your health care provider's instructions regarding medicine use. Specific medicines may be either safe or unsafe to take during pregnancy. Do not take any medicines unless approved by your health care provider.  Take a prenatal vitamin that contains at least 600 micrograms (mcg) of folic acid. Eating and drinking  Eat a healthy diet that includes fresh fruits and vegetables, whole grains, good sources of protein such as meat, eggs, or tofu, and low-fat dairy products.  Avoid raw meat and unpasteurized juice, milk, and cheese. These carry germs that can harm you and your baby.  You may need to take these actions to prevent or treat constipation: ? Drink enough fluid to keep your urine pale yellow. ? Eat foods that are high in fiber, such as beans, whole grains, and fresh fruits and vegetables. ? Limit foods that are high in fat and processed sugars, such as fried or sweet foods. Activity  Exercise only as directed by your health care provider. Most people can continue their usual exercise routine during pregnancy. Try to exercise for 30 minutes at least 5 days a week. Stop exercising if you develop contractions in your uterus.  Stop exercising if you develop pain or cramping in the lower abdomen or lower back.  Avoid exercising if it is very hot or humid or if you are at a high altitude.  Avoid heavy lifting.  If you choose to, you may have sex unless your health care provider tells you not to. Relieving pain and discomfort  Wear a  supportive bra to prevent discomfort from breast tenderness.  Take warm sitz baths to soothe any pain or discomfort caused by hemorrhoids. Use hemorrhoid cream if your health care provider approves.  Rest with your legs raised (elevated) if you have leg cramps or low back pain.  If you develop varicose veins: ? Wear support hose as told by your health care provider. ? Elevate your feet for 15 minutes, 3-4 times a day. ? Limit salt in your diet. Safety  Wear your seat belt at all times when driving or riding in a car.  Talk with your health care provider if someone is verbally or physically abusive to you. Lifestyle  Do not use hot tubs, steam rooms, or saunas.  Do not douche. Do not use tampons or scented sanitary pads.  Avoid cat litter boxes and soil used by cats. These carry germs that can cause birth defects in the baby and possibly loss of the fetus by miscarriage or stillbirth.  Do not use herbal remedies, alcohol, illegal drugs, or medicines that are not approved by your health care provider. Chemicals in these products can harm your baby.  Do not use any products that contain nicotine or tobacco,  such as cigarettes, e-cigarettes, and chewing tobacco. If you need help quitting, ask your health care provider. General instructions  During a routine prenatal visit, your health care provider will do a physical exam and other tests. He or she will also discuss your overall health. Keep all follow-up visits. This is important.  Ask your health care provider for a referral to a local prenatal education class.  Ask for help if you have counseling or nutritional needs during pregnancy. Your health care provider can offer advice or refer you to specialists for help with various needs. Where to find more information  American Pregnancy Association: americanpregnancy.org  [employer] of Obstetricians and Gynecologists: https://www.todd-brady.net/acog.org/en/Womens%20Health/Pregnancy  Office on [employer]'s  Health: MightyReward.co.nzwomenshealth.gov/pregnancy Contact a health care provider if you have:  A headache that does not go away when you take medicine.  Vision changes or you see spots in front of your eyes.  Mild pelvic cramps, pelvic pressure, or nagging pain in the abdominal area.  Persistent nausea, vomiting, or diarrhea.  A bad-smelling vaginal discharge or foul-smelling urine.  Pain when you urinate.  Sudden or extreme swelling of your face, hands, ankles, feet, or legs.  A fever. Get help right away if you:  Have fluid leaking from your vagina.  Have spotting or bleeding from your vagina.  Have severe abdominal cramping or pain.  Have difficulty breathing.  Have chest pain.  Have fainting spells.  Have not felt your baby move for the time period told by your health care provider.  Have new or increased pain, swelling, or redness in an arm or leg. Summary  The second trimester of pregnancy is from week 13 through week 27 (months 4 through 6).  Do not use herbal remedies, alcohol, illegal drugs, or medicines that are not approved by your health care provider. Chemicals in these products can harm your baby.  Exercise only as directed by your health care provider. Most people can continue their usual exercise routine during pregnancy.  Keep all follow-up visits. This is important. This information is not intended to replace advice given to you by your health care provider. Make sure you discuss any questions you have with your health care provider. Document Revised: 04/18/2020 Document Reviewed: 02/23/2020 Elsevier Patient Education  2021 [employer]

## 2021-04-03 LAB — GC/CHLAMYDIA PROBE AMP (~~LOC~~) NOT AT ARMC
Chlamydia: NEGATIVE
Comment: NEGATIVE
Comment: NORMAL
Neisseria Gonorrhea: NEGATIVE

## 2021-04-04 LAB — CULTURE, OB URINE: Culture: <10000 — AB

## 2021-04-13 ENCOUNTER — Other Ambulatory Visit: Payer: Self-pay | Admitting: Women's Health

## 2021-04-13 ENCOUNTER — Encounter: Payer: Self-pay | Admitting: Women's Health

## 2021-04-13 DIAGNOSIS — R17 Unspecified jaundice: Secondary | ICD-10-CM

## 2021-04-13 NOTE — Progress Notes (Signed)
GI Consult entered for elevated bilirubin. Patient messaged with information.  Marylen Ponto, NP  7:15 PM 04/13/2021

## 2021-04-16 ENCOUNTER — Ambulatory Visit: Payer: Medicaid Other

## 2021-04-16 DIAGNOSIS — Z3480 Encounter for supervision of other normal pregnancy, unspecified trimester: Secondary | ICD-10-CM | POA: Insufficient documentation

## 2021-04-16 DIAGNOSIS — O0993 Supervision of high risk pregnancy, unspecified, third trimester: Secondary | ICD-10-CM | POA: Insufficient documentation

## 2021-04-16 MED ORDER — PREPLUS 27-1 MG PO TABS: 1.0000 | ORAL_TABLET | Freq: Every day | ORAL | 13 refills | Status: DC

## 2021-04-16 NOTE — Progress Notes (Signed)
New OB Intake  I connected with  Kari Neal on 04/16/21 at  2:00 PM EDT by telephone and verified that I am speaking with the correct person using two identifiers. Nurse is located at [facility] and pt is located at home.  I discussed the limitations, risks, security and privacy concerns of performing an evaluation and management service by telephone and the availability of in person appointments. I also discussed with the patient that there may be a patient responsible charge related to this service. The patient expressed understanding and agreed to proceed.  I explained I am completing New OB Intake today. We discussed her EDD of 11/17/2021 that is based on LMP of 02/10/2021. Pt is G1/P0. I reviewed her allergies, medications, Medical/Surgical/OB history, and appropriate screenings. I informed her of [facility] services. Based on history, this is a/an uncomplicated pregnancy.  Patient Active Problem List   Diagnosis Date Noted  . Serum total bilirubin elevated 04/13/2021  . Abdominal pain, recurrent     Concerns addressed today  Delivery Plans:  Plans to deliver at [facility].   MyChart MyChart access verified. I explained pt will have some visits in office and some virtually.   Blood Pressure Cuff Blood pressure cuff ordered for patient to pick-up from [employer]. Explained after first prenatal appt pt will check weekly and document in Babyscripts.  Anatomy US Explained first scheduled [place] will be around 19 weeks. Advised patient will be scheduled when she comes in for new ob appointment.   Labs Discussed Avelina Laine genetic screening with patient. Would like both Panorama and Horizon drawn at new OB visit. Routine prenatal labs needed.  Covid Vaccine Patient has not covid vaccine.   Social Determinants of Health . Food Insecurity: Patient denies food insecurity. . WIC Referral: Patient is interested in referral to [facility].  . Transportation: Patient denies transportation  needs. . Childcare: Discussed no children allowed at ultrasound appointments. Offered childcare services; patient declines childcare services at this time.  First visit review I reviewed new OB appt with pt. I explained she will have a pelvic exam, ob bloodwork with genetic screening. Explained pt will be seen by Venia Carbon, NP at first visit; encounter routed to appropriate provider. Explained that patient will be seen by pregnancy navigator following visit with provider.  Oneita Hurt, CMA 04/16/2021  2:03 PM

## 2021-04-26 ENCOUNTER — Other Ambulatory Visit: Payer: Self-pay

## 2021-04-26 ENCOUNTER — Encounter: Payer: Self-pay | Admitting: Obstetrics and Gynecology

## 2021-04-26 ENCOUNTER — Ambulatory Visit (INDEPENDENT_AMBULATORY_CARE_PROVIDER_SITE_OTHER): Payer: Medicaid Other | Admitting: Obstetrics and Gynecology

## 2021-04-26 VITALS — BP 128/67 | HR 104 | Wt 175.8 lb

## 2021-04-26 DIAGNOSIS — Z3481 Encounter for supervision of other normal pregnancy, first trimester: Secondary | ICD-10-CM

## 2021-04-26 DIAGNOSIS — Z3A10 10 weeks gestation of pregnancy: Secondary | ICD-10-CM

## 2021-04-26 DIAGNOSIS — J45909 Unspecified asthma, uncomplicated: Secondary | ICD-10-CM | POA: Insufficient documentation

## 2021-04-26 DIAGNOSIS — Z3480 Encounter for supervision of other normal pregnancy, unspecified trimester: Secondary | ICD-10-CM

## 2021-04-26 DIAGNOSIS — O219 Vomiting of pregnancy, unspecified: Secondary | ICD-10-CM | POA: Insufficient documentation

## 2021-04-26 MED ORDER — ALBUTEROL SULFATE HFA 108 (90 BASE) MCG/ACT IN AERS: 1.0000 | INHALATION_SPRAY | Freq: Four times a day (QID) | RESPIRATORY_TRACT | 0 refills | Status: DC | PRN

## 2021-04-26 MED ORDER — ONDANSETRON 8 MG PO TBDP: 8.0000 mg | ORAL_TABLET | Freq: Three times a day (TID) | ORAL | 2 refills | Status: DC | PRN

## 2021-04-26 MED ORDER — ASPIRIN EC 81 MG PO TBEC: 81.0000 mg | DELAYED_RELEASE_TABLET | Freq: Every day | ORAL | 11 refills | Status: DC

## 2021-04-26 NOTE — Progress Notes (Signed)
History:   Kari Neal is a 20 y.o. G1P0 at [redacted]w[redacted]d by LMP being seen today for her first obstetrical visit.  Her obstetrical history is significant for obesity and asthma, borderline diabetes. Patient does intend to breast feed. Pregnancy history fully reviewed. FOB involved, and present today.   Patient reports no complaints.    HISTORY: OB History  Gravida Para Term Preterm AB Living  1 0 0 0 0 0  SAB IAB Ectopic Multiple Live Births  0 0 0 0 0    # Outcome Date GA Lbr Len/2nd Weight Sex Delivery Anes PTL Lv  1 Current             Last pap smear was done NA, Age.   Past Medical History:  Diagnosis Date  . Abdominal pain, recurrent   . Knee crepitus    Past Surgical History:  Procedure Laterality Date  . CHOLECYSTECTOMY N/A 01/13/2019   Procedure: LAPAROSCOPIC CHOLECYSTECTOMY WITH INTRAOPERATIVE CHOLANGIOGRAM ERAS PATHWAY;  Surgeon: Ovidio Kin, MD;  Location: [facility] OR;  Service: General;  Laterality: N/A;   Family History  Problem Relation Age of Onset  . Anxiety disorder Mother   . Miscarriages / [place] Mother   . Diabetes Paternal Aunt   . Cancer Maternal Grandmother   . Breast cancer Maternal Grandmother   . Hypertension Maternal Grandmother   . Miscarriages / Stillbirths Maternal Grandmother   . Diabetes Maternal Grandfather   . Arthritis Paternal Grandmother    Social History   Tobacco Use  . Smoking status: Current Every Day Smoker    Types: E-cigarettes    Start date: 04/26/2019  . Smokeless tobacco: Never Used  Vaping Use  . Vaping Use: Never used  Substance Use Topics  . Alcohol use: Yes  . Drug use: No   Allergies  Allergen Reactions  . Shellfish Allergy Hives   Current Outpatient Medications on File Prior to Visit  Medication Sig Dispense Refill  . Doxylamine-Pyridoxine (DICLEGIS) 10-10 MG TBEC Take 2 tablets by mouth at bedtime. Start by taking two tablets at bedtime on day 1 and 2; if symptoms persist, take 1 tablet in morning and 2  tablets at bedtime on day 3; if symptoms persist, may increase to 1 tablet in morning, 1 tablet mid-afternoon, and 2 tablets at bedtime on day 4 (maximum: doxylamine 40 mg/pyridoxine 40 mg (4 tablets) per day). 60 tablet 2  . Prenatal Vit-Fe Fumarate-FA (PREPLUS) 27-1 MG TABS Take 1 tablet by mouth daily. 30 tablet 13   No current facility-administered medications on file prior to visit.    Review of Systems Pertinent items noted in HPI and remainder of comprehensive ROS otherwise negative.  Physical Exam:   Vitals:   04/26/21 0908  BP: 128/67  Pulse: (!) 104  Weight: 175 lb 12.8 oz (79.7 kg)   Fetal Heart Rate (bpm): 169  General: well-developed, well-nourished female in no acute distress  Skin: normal coloration and turgor, no rashes  Neurologic: oriented, normal, negative, normal mood  Extremities: normal strength, tone, and muscle mass, ROM of all joints is normal  HEENT PERRLA, extraocular movement intact and sclera clear, anicteric  Neck supple and no masses  Cardiovascular: regular rate and rhythm  Respiratory:  no respiratory distress, normal breath sounds  Abdomen: soft, non-tender; bowel sounds normal; no masses,  no organomegaly    Assessment:    Pregnancy: G1P0 Patient Active Problem List   Diagnosis Date Noted  . Asthma 04/26/2021  . Nausea and vomiting in pregnancy  04/26/2021  . Supervision of other normal pregnancy, antepartum 04/16/2021  . Serum total bilirubin elevated 04/13/2021  . Abdominal pain, recurrent      Plan:   1. Supervision of other normal pregnancy, antepartum  - Genetic Screening - Obstetric Panel, Including HIV - HgB A1c - TSH - GC/ chlamydia done on 04/02/21- negative.   2. Mild asthma without complication, unspecified whether persistent  No issues for years Rx: rescue inhaler, albuterol.   3. Nausea and vomiting in pregnancy  Vomiting in patient room. Has diclegis, however not taking it regularly Rx: Zofran    Initial  labs drawn. Continue prenatal vitamins. Problem list reviewed and updated. Genetic Screening discussed, NIPS: requested. Ultrasound discussed; fetal anatomic survey: requested. Anticipatory guidance about prenatal visits given including labs, ultrasounds, and testing. Discussed usage of Babyscripts and virtual visits as additional source of managing and completing prenatal visits in midst of coronavirus and pandemic.   Encouraged to complete MyChart Registration for her ability to review results, send requests, and have questions addressed.  The nature of [place] [facility] with multiple MDs and Advanced Practice Providers was explained to patient; also emphasized that residents, students are part of our team. Routine obstetric precautions reviewed. Encouraged to seek out care at office or emergency room [facility] preferred) for urgent and/or emergent concerns. No follow-ups on file.     Jane Broughton, Harolyn Rutherford, NP Faculty Practice Center for [employer], [facility]

## 2021-05-07 ENCOUNTER — Other Ambulatory Visit: Payer: Self-pay | Admitting: Obstetrics and Gynecology

## 2021-05-07 ENCOUNTER — Other Ambulatory Visit: Payer: Medicaid Other

## 2021-05-07 ENCOUNTER — Other Ambulatory Visit: Payer: Self-pay

## 2021-05-07 DIAGNOSIS — Z3480 Encounter for supervision of other normal pregnancy, unspecified trimester: Secondary | ICD-10-CM

## 2021-05-08 LAB — OBSTETRIC PANEL, INCLUDING HIV
Antibody Screen: NEGATIVE
Basophils Absolute: 0 10*3/uL (ref 0.0–0.2)
Basos: 0 %
EOS (ABSOLUTE): 0 10*3/uL (ref 0.0–0.4)
Eos: 0 %
HIV Screen 4th Generation wRfx: NONREACTIVE
Hematocrit: 27.7 % — ABNORMAL LOW (ref 34.0–46.6)
Hemoglobin: 9.4 g/dL — ABNORMAL LOW (ref 11.1–15.9)
Hepatitis B Surface Ag: NEGATIVE
Immature Grans (Abs): 0 10*3/uL (ref 0.0–0.1)
Immature Granulocytes: 0 %
Lymphocytes Absolute: 1.2 10*3/uL (ref 0.7–3.1)
Lymphs: 11 %
MCH: 32.6 pg (ref 26.6–33.0)
MCHC: 33.9 g/dL (ref 31.5–35.7)
MCV: 96 fL (ref 79–97)
Monocytes Absolute: 0.8 10*3/uL (ref 0.1–0.9)
Monocytes: 7 %
Neutrophils Absolute: 9.4 10*3/uL — ABNORMAL HIGH (ref 1.4–7.0)
Neutrophils: 82 %
Platelets: 299 10*3/uL (ref 150–450)
RBC: 2.88 x10E6/uL — ABNORMAL LOW (ref 3.77–5.28)
RDW: 14.6 % (ref 11.7–15.4)
RPR Ser Ql: NONREACTIVE
Rh Factor: POSITIVE
Rubella Antibodies, IGG: 11 index (ref >0.99)
WBC: 11.5 10*3/uL — ABNORMAL HIGH (ref 3.4–10.8)

## 2021-05-08 LAB — TSH: TSH: 0.006 u[IU]/mL — ABNORMAL LOW (ref 0.450–4.500)

## 2021-05-08 LAB — HEMOGLOBIN A1C
Est. average glucose Bld gHb Est-mCnc: <74 mg/dL
Hgb A1c MFr Bld: <4.2 % — ABNORMAL LOW (ref 4.8–5.6)

## 2021-05-08 LAB — HEPATITIS C ANTIBODY: Hep C Virus Ab: <0.1 s/co ratio (ref 0.0–0.9)

## 2021-05-14 ENCOUNTER — Encounter: Payer: Self-pay | Admitting: Obstetrics and Gynecology

## 2021-05-14 ENCOUNTER — Other Ambulatory Visit: Payer: Self-pay

## 2021-05-17 ENCOUNTER — Other Ambulatory Visit: Payer: Self-pay

## 2021-05-17 NOTE — Telephone Encounter (Signed)
Pharmacy sent over request for refill on albuterol inhaler. However, patient just filled medication on 04/26/21. Please advise if you want to have any further refills. Thanks.

## 2021-05-20 ENCOUNTER — Encounter: Payer: Self-pay | Admitting: Obstetrics and Gynecology

## 2021-05-20 ENCOUNTER — Other Ambulatory Visit: Payer: Self-pay | Admitting: Obstetrics and Gynecology

## 2021-05-20 MED ORDER — PREPLUS 27-1 MG PO TABS: 1.0000 | ORAL_TABLET | Freq: Every day | ORAL | 13 refills | Status: DC

## 2021-05-21 ENCOUNTER — Other Ambulatory Visit: Payer: Self-pay

## 2021-05-21 MED ORDER — PREPLUS 27-1 MG PO TABS: 1.0000 | ORAL_TABLET | Freq: Every day | ORAL | 13 refills | Status: DC

## 2021-05-21 NOTE — Telephone Encounter (Signed)
Spoke with patient. She does not need inhaler but prenatal vitamins. Sent Rx for prenatal vitamins instead.

## 2021-05-24 ENCOUNTER — Other Ambulatory Visit: Payer: Self-pay

## 2021-05-24 ENCOUNTER — Ambulatory Visit (INDEPENDENT_AMBULATORY_CARE_PROVIDER_SITE_OTHER): Payer: Medicaid Other | Admitting: Nurse Practitioner

## 2021-05-24 VITALS — BP 126/69 | HR 100 | Wt 173.4 lb

## 2021-05-24 DIAGNOSIS — Z3480 Encounter for supervision of other normal pregnancy, unspecified trimester: Secondary | ICD-10-CM

## 2021-05-24 DIAGNOSIS — R7989 Other specified abnormal findings of blood chemistry: Secondary | ICD-10-CM | POA: Insufficient documentation

## 2021-05-24 DIAGNOSIS — Z3A14 14 weeks gestation of pregnancy: Secondary | ICD-10-CM

## 2021-05-24 DIAGNOSIS — O99019 Anemia complicating pregnancy, unspecified trimester: Secondary | ICD-10-CM

## 2021-05-24 DIAGNOSIS — Z6835 Body mass index (BMI) 35.0-35.9, adult: Secondary | ICD-10-CM | POA: Insufficient documentation

## 2021-05-24 LAB — SPECIMEN STATUS REPORT

## 2021-05-24 LAB — T3, FREE: T3, Free: 4.8 pg/mL (ref 2.3–5.0)

## 2021-05-24 LAB — T4, FREE: Free T4: 1.49 ng/dL (ref 0.93–1.60)

## 2021-05-24 NOTE — Progress Notes (Signed)
Pt states she is not feeling fetal movement yet, denies pain.

## 2021-05-24 NOTE — Progress Notes (Signed)
    Subjective:  Kari Neal is a 20 y.o. G1P0 at [redacted]w[redacted]d being seen today for ongoing prenatal care.  She is currently monitored for the following issues for this low-risk pregnancy and has Abdominal pain, recurrent; Serum total bilirubin elevated; Supervision of other normal pregnancy, antepartum; Asthma; and Nausea and vomiting in pregnancy on their problem list.  Patient reports no complaints.  Contractions: Not present. Vag. Bleeding: None.   . Denies leaking of fluid.   The following portions of the patient's history were reviewed and updated as appropriate: allergies, current medications, past family history, past medical history, past social history, past surgical history and problem list. Problem list updated.  Objective:   Vitals:   05/24/21 1111  BP: 126/69  Pulse: 100  Weight: 173 lb 6.4 oz (78.7 kg)    Fetal Status: Fetal Heart Rate (bpm): 152 Fundal Height: 13 cm       General:  Alert, oriented and cooperative. Patient is in no acute distress.  Skin: Skin is warm and dry. No rash noted.   Cardiovascular: Normal heart rate noted  Respiratory: Normal respiratory effort, no problems with respiration noted  Abdomen: Soft, gravid, appropriate for gestational age. Pain/Pressure: Absent     Pelvic:  Cervical exam deferred        Extremities: Normal range of motion.  Edema: None  Mental Status: Normal mood and affect. Normal behavior. Normal judgment and thought content.   Urinalysis:      Assessment and Plan:  Pregnancy: G1P0 at [redacted]w[redacted]d  1. Supervision of other normal pregnancy, antepartum Will schedule for anatomy US She is attending classes with other teen moms AFP at next visit Has Mychart - need to check at next visit to see if she has babyscripts  - [place] MFM OB COMP + 14 WK; Future  2. BMI 35.0-35.9,adult   3. Low thyroid stimulating hormone (TSH) level Free T3 and Free T4 were normal  - consulted with Dr. Jolayne Panther - no treatment needed now  4. Anemia HGB was  9.4 and drawn during time she was vomiting Stressed iron rick diet and will recheck CBC at next visit to see if supplementation is needed.  5.  Nausea and vomiting Controlled with taking Diclegis - much improved  Preterm labor symptoms and general obstetric precautions including but not limited to vaginal bleeding, contractions, leaking of fluid and fetal movement were reviewed in detail with the patient. Please refer to After Visit Summary for other counseling recommendations.  Return in about 4 weeks (around 06/21/2021) for in person ROB.  Nolene Bernheim, RN, MSN, NP-BC Nurse Practitioner, [facility] for [employer], [facility] 05/24/2021 11:42 AM

## 2021-06-21 ENCOUNTER — Other Ambulatory Visit: Payer: Self-pay

## 2021-06-21 ENCOUNTER — Ambulatory Visit (INDEPENDENT_AMBULATORY_CARE_PROVIDER_SITE_OTHER): Payer: Medicaid Other | Admitting: Obstetrics & Gynecology

## 2021-06-21 VITALS — BP 112/70 | HR 101 | Wt 173.3 lb

## 2021-06-21 DIAGNOSIS — Z6835 Body mass index (BMI) 35.0-35.9, adult: Secondary | ICD-10-CM

## 2021-06-21 DIAGNOSIS — R7989 Other specified abnormal findings of blood chemistry: Secondary | ICD-10-CM

## 2021-06-21 DIAGNOSIS — O219 Vomiting of pregnancy, unspecified: Secondary | ICD-10-CM

## 2021-06-21 DIAGNOSIS — Z3480 Encounter for supervision of other normal pregnancy, unspecified trimester: Secondary | ICD-10-CM

## 2021-06-21 DIAGNOSIS — O99019 Anemia complicating pregnancy, unspecified trimester: Secondary | ICD-10-CM

## 2021-06-21 MED ORDER — FERROUS SULFATE 325 (65 FE) MG PO TABS: 325.0000 mg | ORAL_TABLET | ORAL | 5 refills | Status: DC

## 2021-06-21 NOTE — Progress Notes (Signed)
  Subjective:    Kari Neal is a G1P0 [redacted]w[redacted]d being seen today for her first obstetrical visit.  Her obstetrical history is significant for subclinical hypothyroid. Patient does intend to breast feed. Pregnancy history fully reviewed.  Patient reports no complaints.  Vitals:   06/21/21 1018  BP: 112/70  Pulse: (!) 101  Weight: 173 lb 4.8 oz (78.6 kg)    HISTORY: OB History  Gravida Para Term Preterm AB Living  1            SAB IAB Ectopic Multiple Live Births               # Outcome Date GA Lbr Len/2nd Weight Sex Delivery Anes PTL Lv  1 Current            Past Medical History:  Diagnosis Date  . Abdominal pain, recurrent   . Knee crepitus    Past Surgical History:  Procedure Laterality Date  . CHOLECYSTECTOMY N/A 01/13/2019   Procedure: LAPAROSCOPIC CHOLECYSTECTOMY WITH INTRAOPERATIVE CHOLANGIOGRAM ERAS PATHWAY;  Surgeon: Ovidio Kin, MD;  Location: [facility] OR;  Service: General;  Laterality: N/A;   Family History  Problem Relation Age of Onset  . Anxiety disorder Mother   . Miscarriages / [place] Mother   . Diabetes Paternal Aunt   . Cancer Maternal Grandmother   . Breast cancer Maternal Grandmother   . Hypertension Maternal Grandmother   . Miscarriages / Stillbirths Maternal Grandmother   . Diabetes Maternal Grandfather   . Arthritis Paternal Grandmother      Exam    Uterus:     Pelvic Exam:    Perineum: No Hemorrhoids   Vulva: normal   Vagina:  normal mucosa   pH:    Cervix: no lesions   Adnexa: not evaluated   Bony Pelvis: average  System: Breast:  normal appearance, no masses or tenderness   Skin: normal coloration and turgor, no rashes    Neurologic: oriented, normal mood   Extremities: normal strength, tone, and muscle mass   HEENT extra ocular movement intact   Mouth/Teeth mucous membranes moist, pharynx normal without lesions   Neck supple   Cardiovascular: regular rate and rhythm, no murmurs or gallops   Respiratory:  appears well, vitals  normal, no respiratory distress, acyanotic, normal RR, neck free of mass or lymphadenopathy, chest clear, no wheezing, crepitations, rhonchi, normal symmetric air entry   Abdomen: soft, non-tender; bowel sounds normal; no masses,  no organomegaly   Urinary: urethral meatus normal      Assessment:    Pregnancy: G1P0 Patient Active Problem List   Diagnosis Date Noted  . Anemia affecting pregnancy, antepartum 05/24/2021  . Low thyroid stimulating hormone (TSH) level 05/24/2021  . BMI 35.0-35.9,adult 05/24/2021  . Asthma 04/26/2021  . Nausea and vomiting in pregnancy 04/26/2021  . Supervision of other normal pregnancy, antepartum 04/16/2021  . Serum total bilirubin elevated 04/13/2021  . Abdominal pain, recurrent         Plan:     Initial labs drawn. Prenatal vitamins. Problem list reviewed and updated. Genetic Screening discussed . 50% of 30 min visit spent on counseling and coordination of care.     Scheryl Darter 06/21/2021

## 2021-06-21 NOTE — Progress Notes (Signed)
ROB 18.5 wks No complaints

## 2021-06-23 LAB — CBC
Hematocrit: 25.1 % — ABNORMAL LOW (ref 34.0–46.6)
Hemoglobin: 8.7 g/dL — ABNORMAL LOW (ref 11.1–15.9)
MCH: 32.3 pg (ref 26.6–33.0)
MCHC: 34.7 g/dL (ref 31.5–35.7)
MCV: 93 fL (ref 79–97)
Platelets: 316 10*3/uL (ref 150–450)
RBC: 2.69 x10E6/uL — CL (ref 3.77–5.28)
RDW: 16.1 % — ABNORMAL HIGH (ref 11.7–15.4)
WBC: 9.9 10*3/uL (ref 3.4–10.8)

## 2021-06-23 LAB — AFP, SERUM, OPEN SPINA BIFIDA
AFP MoM: 0.74
AFP Value: 36 ng/mL
Gest. Age on Collection Date: 18.5 weeks
Maternal Age At EDD: 19.4 yr
OSBR Risk 1 IN: 10000
Test Results:: NEGATIVE
Weight: 173 [lb_av]

## 2021-06-24 ENCOUNTER — Other Ambulatory Visit: Payer: Self-pay | Admitting: Nurse Practitioner

## 2021-06-24 ENCOUNTER — Other Ambulatory Visit: Payer: Self-pay

## 2021-06-24 ENCOUNTER — Ambulatory Visit: Payer: Medicaid Other | Attending: Obstetrics | Admitting: Obstetrics

## 2021-06-24 ENCOUNTER — Ambulatory Visit: Payer: Medicaid Other | Attending: Nurse Practitioner

## 2021-06-24 ENCOUNTER — Other Ambulatory Visit: Payer: Self-pay | Admitting: *Deleted

## 2021-06-24 DIAGNOSIS — Z3A19 19 weeks gestation of pregnancy: Secondary | ICD-10-CM

## 2021-06-24 DIAGNOSIS — Z3480 Encounter for supervision of other normal pregnancy, unspecified trimester: Secondary | ICD-10-CM

## 2021-06-24 DIAGNOSIS — O99212 Obesity complicating pregnancy, second trimester: Secondary | ICD-10-CM

## 2021-06-24 DIAGNOSIS — E079 Disorder of thyroid, unspecified: Secondary | ICD-10-CM

## 2021-06-24 DIAGNOSIS — Z6835 Body mass index (BMI) 35.0-35.9, adult: Secondary | ICD-10-CM

## 2021-06-24 DIAGNOSIS — O99282 Endocrine, nutritional and metabolic diseases complicating pregnancy, second trimester: Secondary | ICD-10-CM | POA: Diagnosis not present

## 2021-06-24 DIAGNOSIS — O359XX0 Maternal care for (suspected) fetal abnormality and damage, unspecified, not applicable or unspecified: Secondary | ICD-10-CM

## 2021-06-24 NOTE — Progress Notes (Signed)
MFM Note  Kari Neal was seen for a detailed fetal anatomy scan due to maternal obesity.  Her TSH levels were noted to be very low (0.006).  However, her free T4 levels and free T3 levels were all within normal limits indicating that she has subclinical hyperthyroidism.  She remains asymptomatic.  She denies any other significant past medical history and denies any problems in her current pregnancy.    She had a cell free DNA test earlier in her pregnancy which indicated a low risk for trisomy 19, 61, and 13. A female fetus is predicted.  She was informed that the fetal growth and amniotic fluid level were appropriate for her gestational age.   On today's exam, an intracardiac echogenic focus was noted in the left ventricle of the fetal heart.  The small association between an echogenic focus and Down syndrome was discussed.   Bilateral choroid plexus cysts were also noted in the fetal brain.  The implications and management of choroid plexus cysts were discussed today.  She was advised that the choroid plexus cysts are most likely normal variants and will usually resolve at around 24 weeks.  The small association of bilateral choroid plexus cysts with trisomy 18 was discussed.  She was advised that as no other anomalies were noted on today's ultrasound exam, it is highly unlikely that her fetus has trisomy 12.    Due to the echogenic focus and bilateral choroid plexus cysts noted today, the patient was offered and declined an amniocentesis today for definitive diagnosis of fetal aneuploidy.  She reports that she is comfortable with her negative cell free DNA test.  The patient was informed that anomalies may be missed due to technical limitations. If the fetus is in a suboptimal position or maternal habitus is increased, visualization of the fetus in the maternal uterus may be impaired.  Subclinical hyperthyroidism in pregnancy  Subclinical hyperthyroidism is characterized by an abnormally low  TSH with normal free T4 levels.  According to the most recent ACOG practice bulletin published in 2020, subclinical hyperthyroidism has not been associated with adverse pregnancy outcomes.  Therefore, treatment of pregnant women with subclinical hyperthyroidism is not recommended.  She should continue to have her TSH and free T4 levels monitored on a monthly basis.  Treatment with either PTU or methimazole may be indicated should her TSH levels be low and her free T4 levels be increased.  We will continue to follow her with growth ultrasounds throughout her pregnancy due to subclinical hyperthyroidism and maternal obesity.  The patient stated that all of her questions have been answered to her satisfaction.    A follow-up growth scan was scheduled in 4 weeks.  A total of 35 minutes was spent counseling and coordinating the care for this patient.  Greater than 50% of the time was spent in direct face-to-face contact.  Recommendations:  No treatment for subclinical hyperthyroidism is recommended Monthly TSH and free T4 levels Repeat growth ultrasound in 4 weeks

## 2021-07-01 ENCOUNTER — Other Ambulatory Visit: Payer: Self-pay

## 2021-07-01 ENCOUNTER — Encounter (HOSPITAL_COMMUNITY): Payer: Self-pay

## 2021-07-01 ENCOUNTER — Ambulatory Visit (HOSPITAL_COMMUNITY)
Admission: EM | Admit: 2021-07-01 | Discharge: 2021-07-01 | Disposition: A | Discharge status: Home / Self Care | Payer: Medicaid Other | Attending: Physician Assistant | Admitting: Physician Assistant

## 2021-07-01 DIAGNOSIS — O98512 Other viral diseases complicating pregnancy, second trimester: Secondary | ICD-10-CM | POA: Insufficient documentation

## 2021-07-01 DIAGNOSIS — O99332 Smoking (tobacco) complicating pregnancy, second trimester: Secondary | ICD-10-CM | POA: Insufficient documentation

## 2021-07-01 DIAGNOSIS — Z3A20 20 weeks gestation of pregnancy: Secondary | ICD-10-CM

## 2021-07-01 DIAGNOSIS — U071 COVID-19: Secondary | ICD-10-CM | POA: Diagnosis not present

## 2021-07-01 DIAGNOSIS — R8290 Unspecified abnormal findings in urine: Secondary | ICD-10-CM

## 2021-07-01 DIAGNOSIS — O2342 Unspecified infection of urinary tract in pregnancy, second trimester: Secondary | ICD-10-CM

## 2021-07-01 DIAGNOSIS — R350 Frequency of micturition: Secondary | ICD-10-CM | POA: Diagnosis present

## 2021-07-01 DIAGNOSIS — O99512 Diseases of the respiratory system complicating pregnancy, second trimester: Secondary | ICD-10-CM

## 2021-07-01 DIAGNOSIS — J069 Acute upper respiratory infection, unspecified: Secondary | ICD-10-CM | POA: Diagnosis present

## 2021-07-01 DIAGNOSIS — Z7982 Long term (current) use of aspirin: Secondary | ICD-10-CM | POA: Insufficient documentation

## 2021-07-01 DIAGNOSIS — F17290 Nicotine dependence, other tobacco product, uncomplicated: Secondary | ICD-10-CM | POA: Insufficient documentation

## 2021-07-01 DIAGNOSIS — O26892 Other specified pregnancy related conditions, second trimester: Secondary | ICD-10-CM | POA: Insufficient documentation

## 2021-07-01 LAB — POCT URINALYSIS DIPSTICK, ED / UC
Bilirubin Urine: NEGATIVE
Glucose, UA: NEGATIVE mg/dL
Hgb urine dipstick: NEGATIVE
Ketones, ur: 40 mg/dL — AB
Nitrite: NEGATIVE
Protein, ur: NEGATIVE mg/dL
Specific Gravity, Urine: 1.015 (ref 1.005–1.030)
Urobilinogen, UA: 1 mg/dL (ref 0.0–1.0)
pH: 6 (ref 5.0–8.0)

## 2021-07-01 LAB — POC INFLUENZA A AND B ANTIGEN (URGENT CARE ONLY)
INFLUENZA A ANTIGEN, POC: NEGATIVE
INFLUENZA B ANTIGEN, POC: NEGATIVE

## 2021-07-01 MED ORDER — CEPHALEXIN 500 MG PO CAPS: 500.0000 mg | ORAL_CAPSULE | Freq: Three times a day (TID) | ORAL | 0 refills | Status: DC

## 2021-07-01 NOTE — Discharge Instructions (Addendum)
Your flu was negative.  We will contact you if your COVID is positive.  Your urine did have some evidence of infection so we are going to treat you for urinary tract infection with Keflex 3 times a day.  If we need to change this based on the culture results we will contact you.  Make sure you are drinking plenty of fluid.  Use Tylenol for additional fever and pain relief.  If you have any worsening symptoms you need to be reevaluated.

## 2021-07-01 NOTE — ED Provider Notes (Signed)
[facility]    CSN: [id number] Arrival date & time: 07/01/21  1750      History   Chief Complaint Chief Complaint  Patient presents with   Headache   Neck Pain   Cough    HPI Kari Neal is a 20 y.o. female.   Patient presents today with a 3-day history of URI symptoms.  Reports nasal congestion, neck pain, body aches, back pain, cough, headache.  She denies any chest pain, shortness of breath, nausea, vomiting increased from baseline (patient is [redacted] weeks pregnant).  She does report known sick contacts who have tested positive for COVID-19 but she took a COVID test yesterday that was negative.  She has had COVID-19 vaccinations but has not had flu shot.  She does have a history of allergies but is not currently taking any medication for this.  She has a history of asthma and has required use of albuterol inhaler more frequently since onset.  She reports association of feeling hot and subjective fever but has measured her temperature which has been normal.  She does report some urinary frequency and so is interested in having her urine checked as well today.  She has not been taking any over-the-counter medications due to pregnancy.   Past Medical History:  Diagnosis Date   Abdominal pain, recurrent    Knee crepitus     Patient Active Problem List   Diagnosis Date Noted   Anemia affecting pregnancy, antepartum 05/24/2021   Low thyroid stimulating hormone (TSH) level 05/24/2021   BMI 35.0-35.9,adult 05/24/2021   Asthma 04/26/2021   Nausea and vomiting in pregnancy 04/26/2021   Supervision of other normal pregnancy, antepartum 04/16/2021   Serum total bilirubin elevated 04/13/2021   Abdominal pain, recurrent     Past Surgical History:  Procedure Laterality Date   CHOLECYSTECTOMY N/A 01/13/2019   Procedure: LAPAROSCOPIC CHOLECYSTECTOMY WITH INTRAOPERATIVE CHOLANGIOGRAM ERAS PATHWAY;  Surgeon: Ovidio Kin, MD;  Location: [facility] OR;  Service: General;  Laterality: N/A;     OB History     Gravida  1   Para      Term      Preterm      AB      Living         SAB      IAB      Ectopic      Multiple      Live Births               Home Medications    Prior to Admission medications   Medication Sig Start Date End Date Taking? Authorizing Provider  cephALEXin (KEFLEX) 500 MG capsule Take 1 capsule (500 mg total) by mouth 3 (three) times daily. 07/01/21  Yes Menna Abeln K, PA-C  albuterol (VENTOLIN HFA) 108 (90 Base) MCG/ACT inhaler Inhale 1-2 puffs into the lungs every 6 (six) hours as needed for wheezing or shortness of breath. 04/26/21   Rasch, Victorino Dike I, NP  aspirin EC 81 MG tablet Take 1 tablet (81 mg total) by mouth daily. Swallow whole. 04/26/21   Rasch, Victorino Dike I, NP  Doxylamine-Pyridoxine (DICLEGIS) 10-10 MG TBEC Take 2 tablets by mouth at bedtime. Start by taking two tablets at bedtime on day 1 and 2; if symptoms persist, take 1 tablet in morning and 2 tablets at bedtime on day 3; if symptoms persist, may increase to 1 tablet in morning, 1 tablet mid-afternoon, and 2 tablets at bedtime on day 4 (maximum: doxylamine 40 mg/pyridoxine 40 mg (  4 tablets) per day). 04/02/21   Nugent, Odie SeraNicole E, NP  ferrous sulfate 325 (65 FE) MG tablet Take 1 tablet (325 mg total) by mouth every other day. 06/21/21   Adam PhenixArnold, James G, MD  ondansetron (ZOFRAN ODT) 8 MG disintegrating tablet Take 1 tablet (8 mg total) by mouth every 8 (eight) hours as needed for nausea or vomiting. 04/26/21   Rasch, Victorino DikeJennifer I, NP  Prenatal Vit-Fe Fumarate-FA (PREPLUS) 27-1 MG TABS Take 1 tablet by mouth daily. 05/21/21   Reva BoresPratt, Tanya S, MD    Family History Family History  Problem Relation Age of Onset   Anxiety disorder Mother    Miscarriages / [place] Mother    Diabetes Paternal Aunt    Cancer Maternal Grandmother    Breast cancer Maternal Grandmother    Hypertension Maternal Grandmother    Miscarriages / Stillbirths Maternal Grandmother    Diabetes Maternal  Grandfather    Arthritis Paternal Grandmother     Social History Social History   Tobacco Use   Smoking status: Every Day    Types: E-cigarettes    Start date: 04/26/2019   Smokeless tobacco: Never  Vaping Use   Vaping Use: Never used  Substance Use Topics   Alcohol use: Yes   Drug use: No     Allergies   Shellfish allergy   Review of Systems Review of Systems  Constitutional:  Positive for activity change, fatigue and fever. Negative for appetite change.  HENT:  Positive for congestion, sinus pressure and sore throat. Negative for sneezing.   Respiratory:  Positive for cough. Negative for shortness of breath.   Cardiovascular:  Negative for chest pain.  Gastrointestinal:  Negative for abdominal pain, diarrhea, nausea and vomiting.  Musculoskeletal:  Positive for arthralgias, back pain, myalgias and neck pain.  Neurological:  Positive for headaches. Negative for dizziness and light-headedness.    Physical Exam Triage Vital Signs ED Triage Vitals  Enc Vitals Group     BP 07/01/21 1916 126/68     Pulse Rate 07/01/21 1916 (!) 109     Resp 07/01/21 1916 18     Temp 07/01/21 1916 98.5 F (36.9 C)     Temp Source 07/01/21 1916 Oral     SpO2 07/01/21 1916 100 %     Weight --      Height --      Head Circumference --      Peak Flow --      Pain Score 07/01/21 1913 8     Pain Loc --      Pain Edu? --      Excl. in GC? --    No data found.  Updated Vital Signs BP 126/68 (BP Location: Right Arm)   Pulse (!) 109   Temp 98.5 F (36.9 C) (Oral)   Resp 18   LMP 02/10/2021   SpO2 100%   Visual Acuity Right Eye Distance:   Left Eye Distance:   Bilateral Distance:    Right Eye Near:   Left Eye Near:    Bilateral Near:     Physical Exam Vitals reviewed.  Constitutional:      General: She is awake. She is not in acute distress.    Appearance: Normal appearance. She is normal weight. She is not ill-appearing.     Comments: Very pleasant female appears stated  age in no acute distress sitting comfortably in exam room  HENT:     Head: Normocephalic and atraumatic.     Right Ear:  Tympanic membrane, ear canal and external ear normal. Tympanic membrane is not erythematous or bulging.     Left Ear: Tympanic membrane, ear canal and external ear normal. Tympanic membrane is not erythematous or bulging.     Nose:     Right Sinus: Maxillary sinus tenderness present. No frontal sinus tenderness.     Left Sinus: Maxillary sinus tenderness present. No frontal sinus tenderness.     Mouth/Throat:     Pharynx: Uvula midline. Posterior oropharyngeal erythema present. No oropharyngeal exudate.  Cardiovascular:     Rate and Rhythm: Normal rate and regular rhythm.     Heart sounds: Normal heart sounds, S1 normal and S2 normal. No murmur heard. Pulmonary:     Effort: Pulmonary effort is normal.     Breath sounds: Normal breath sounds. No wheezing, rhonchi or rales.     Comments: Clear to auscultation bilaterally Lymphadenopathy:     Head:     Right side of head: No submental, submandibular or tonsillar adenopathy.     Left side of head: No submental, submandibular or tonsillar adenopathy.     Cervical: No cervical adenopathy.  Psychiatric:        Behavior: Behavior is cooperative.     UC Treatments / Results  Labs (all labs ordered are listed, but only abnormal results are displayed) Labs Reviewed  POCT URINALYSIS DIPSTICK, ED / UC - Abnormal; Notable for the following components:      Result Value   Ketones, ur 40 (*)    Leukocytes,Ua SMALL (*)    All other components within normal limits  SARS CORONAVIRUS 2 (TAT 6-24 HRS)  URINE CULTURE  POC INFLUENZA A AND B ANTIGEN (URGENT CARE ONLY)    EKG   Radiology No results found.  Procedures Procedures (including critical care time)  Medications Ordered in UC Medications - No data to display  Initial Impression / Assessment and Plan / UC Course  I have reviewed the triage vital signs and the  nursing notes.  Pertinent labs & imaging results that were available during my care of the patient were reviewed by me and considered in my medical decision making (see chart for details).      Flu test was negative.  COVID test is pending.  Discussed likely viral etiology given short duration of symptoms.  Given urinary frequency UA was checked that did show positive leukocyte esterase so will cover with Keflex.  Urine culture was obtained discussed we will change antibiotics based on susceptibilities identified culture.  Recommended she contact her OB/GYN for follow-up and for additional recommendations regarding over-the-counter medications that can be used to help manage her symptoms.  She was encouraged to use Tylenol and drink plenty of fluid.  Discussed alarm symptoms that warrant emergent evaluation.  Strict return precautions given to which patient expressed understanding.  Final Clinical Impressions(s) / UC Diagnoses   Final diagnoses:  Upper respiratory tract infection, unspecified type  Urinary frequency  Abnormal urinalysis     Discharge Instructions      Your flu was negative.  We will contact you if your COVID is positive.  Your urine did have some evidence of infection so we are going to treat you for urinary tract infection with Keflex 3 times a day.  If we need to change this based on the culture results we will contact you.  Make sure you are drinking plenty of fluid.  Use Tylenol for additional fever and pain relief.  If you have any worsening symptoms  you need to be reevaluated.     ED Prescriptions     Medication Sig Dispense Auth. Provider   cephALEXin (KEFLEX) 500 MG capsule Take 1 capsule (500 mg total) by mouth 3 (three) times daily. 20 capsule Jinna Weinman, Noberto Retort, PA-C      PDMP not reviewed this encounter.   Jeani Hawking, PA-C 07/01/21 2055

## 2021-07-01 NOTE — ED Triage Notes (Signed)
Pt reports cough, nasal congestion, neck pain and headache x  2-3 days.   Pt reports she is [redacted] weeks pregnant.

## 2021-07-02 LAB — SARS CORONAVIRUS 2 (TAT 6-24 HRS): SARS Coronavirus 2: POSITIVE — AB

## 2021-07-03 LAB — URINE CULTURE

## 2021-07-19 ENCOUNTER — Ambulatory Visit (INDEPENDENT_AMBULATORY_CARE_PROVIDER_SITE_OTHER): Payer: Medicaid Other | Admitting: Obstetrics and Gynecology

## 2021-07-19 ENCOUNTER — Other Ambulatory Visit: Payer: Self-pay

## 2021-07-19 ENCOUNTER — Encounter: Payer: Self-pay | Admitting: Obstetrics and Gynecology

## 2021-07-19 VITALS — BP 112/71 | HR 101 | Wt 174.0 lb

## 2021-07-19 DIAGNOSIS — Z3480 Encounter for supervision of other normal pregnancy, unspecified trimester: Secondary | ICD-10-CM

## 2021-07-19 DIAGNOSIS — Z3A22 22 weeks gestation of pregnancy: Secondary | ICD-10-CM

## 2021-07-19 DIAGNOSIS — R7989 Other specified abnormal findings of blood chemistry: Secondary | ICD-10-CM

## 2021-07-19 NOTE — Progress Notes (Signed)
   PRENATAL VISIT NOTE  Subjective:  Kari Neal is a 20 y.o. G1P0 at [redacted]w[redacted]d being seen today for ongoing prenatal care.  She is currently monitored for the following issues for this low-risk pregnancy and has Abdominal pain, recurrent; Serum total bilirubin elevated; Supervision of other normal pregnancy, antepartum; Asthma; Nausea and vomiting in pregnancy; Anemia affecting pregnancy, antepartum; Low thyroid stimulating hormone (TSH) level; and BMI 35.0-35.9,adult on their problem list.  Patient reports no complaints.  Contractions: Not present. Vag. Bleeding: None.  Movement: Present. Denies leaking of fluid.   The following portions of the patient's history were reviewed and updated as appropriate: allergies, current medications, past family history, past medical history, past social history, past surgical history and problem list.   Objective:   Vitals:   07/19/21 1058  BP: 112/71  Pulse: (!) 101  Weight: 174 lb (78.9 kg)    Fetal Status: Fetal Heart Rate (bpm): 140 Fundal Height: 22 cm Movement: Present     General:  Alert, oriented and cooperative. Patient is in no acute distress.  Skin: Skin is warm and dry. No rash noted.   Cardiovascular: Normal heart rate noted  Respiratory: Normal respiratory effort, no problems with respiration noted  Abdomen: Soft, gravid, appropriate for gestational age.  Pain/Pressure: Absent     Pelvic: Cervical exam deferred        Extremities: Normal range of motion.  Edema: None  Mental Status: Normal mood and affect. Normal behavior. Normal judgment and thought content.   Assessment and Plan:  Pregnancy: G1P0 at [redacted]w[redacted]d 1. Supervision of other normal pregnancy, antepartum Patient is doing well without complaints Third trimeter labs with glucola next visit Follow up growth ultrasound  2. Low thyroid stimulating hormone (TSH) level Labs today  Preterm labor symptoms and general obstetric precautions including but not limited to vaginal  bleeding, contractions, leaking of fluid and fetal movement were reviewed in detail with the patient. Please refer to After Visit Summary for other counseling recommendations.   Return in about 4 weeks (around 08/16/2021) for in person, ROB, Low risk.  Future Appointments  Date Time Provider Department Center  07/22/2021 12:30 PM [facility] NURSE [facility]  07/22/2021 12:45 PM [facility] US5 [facility]    Catalina Antigua, MD

## 2021-07-19 NOTE — Progress Notes (Signed)
+   fetal movement. No complaints.  

## 2021-07-20 LAB — TSH: TSH: 0.024 u[IU]/mL — ABNORMAL LOW (ref 0.450–4.500)

## 2021-07-20 LAB — T4, FREE: Free T4: 1.16 ng/dL (ref 0.93–1.60)

## 2021-07-22 ENCOUNTER — Ambulatory Visit: Payer: Medicaid Other | Attending: Obstetrics

## 2021-07-22 ENCOUNTER — Other Ambulatory Visit: Payer: Self-pay | Admitting: *Deleted

## 2021-07-22 ENCOUNTER — Ambulatory Visit: Payer: Medicaid Other | Admitting: *Deleted

## 2021-07-22 ENCOUNTER — Other Ambulatory Visit: Payer: Self-pay

## 2021-07-22 ENCOUNTER — Encounter: Payer: Self-pay | Admitting: *Deleted

## 2021-07-22 VITALS — BP 121/65 | HR 95

## 2021-07-22 DIAGNOSIS — O219 Vomiting of pregnancy, unspecified: Secondary | ICD-10-CM

## 2021-07-22 DIAGNOSIS — Z362 Encounter for other antenatal screening follow-up: Secondary | ICD-10-CM

## 2021-07-22 DIAGNOSIS — O99282 Endocrine, nutritional and metabolic diseases complicating pregnancy, second trimester: Secondary | ICD-10-CM | POA: Diagnosis not present

## 2021-07-22 DIAGNOSIS — E079 Disorder of thyroid, unspecified: Secondary | ICD-10-CM

## 2021-07-22 DIAGNOSIS — D649 Anemia, unspecified: Secondary | ICD-10-CM

## 2021-07-22 DIAGNOSIS — E0590 Thyrotoxicosis, unspecified without thyrotoxic crisis or storm: Secondary | ICD-10-CM | POA: Diagnosis not present

## 2021-07-22 DIAGNOSIS — O99212 Obesity complicating pregnancy, second trimester: Secondary | ICD-10-CM | POA: Insufficient documentation

## 2021-07-22 DIAGNOSIS — Z3A23 23 weeks gestation of pregnancy: Secondary | ICD-10-CM | POA: Diagnosis not present

## 2021-07-22 DIAGNOSIS — E669 Obesity, unspecified: Secondary | ICD-10-CM

## 2021-07-22 DIAGNOSIS — J45909 Unspecified asthma, uncomplicated: Secondary | ICD-10-CM | POA: Insufficient documentation

## 2021-07-22 DIAGNOSIS — O99512 Diseases of the respiratory system complicating pregnancy, second trimester: Secondary | ICD-10-CM | POA: Insufficient documentation

## 2021-07-22 DIAGNOSIS — R17 Unspecified jaundice: Secondary | ICD-10-CM | POA: Insufficient documentation

## 2021-07-22 DIAGNOSIS — O99332 Smoking (tobacco) complicating pregnancy, second trimester: Secondary | ICD-10-CM

## 2021-07-22 DIAGNOSIS — Z6835 Body mass index (BMI) 35.0-35.9, adult: Secondary | ICD-10-CM

## 2021-07-22 DIAGNOSIS — O99012 Anemia complicating pregnancy, second trimester: Secondary | ICD-10-CM | POA: Diagnosis not present

## 2021-07-22 DIAGNOSIS — O09892 Supervision of other high risk pregnancies, second trimester: Secondary | ICD-10-CM

## 2021-08-16 ENCOUNTER — Encounter: Payer: Self-pay | Admitting: Obstetrics

## 2021-08-16 ENCOUNTER — Ambulatory Visit (INDEPENDENT_AMBULATORY_CARE_PROVIDER_SITE_OTHER): Payer: Medicaid Other | Admitting: Obstetrics

## 2021-08-16 ENCOUNTER — Other Ambulatory Visit: Payer: Self-pay

## 2021-08-16 VITALS — BP 115/71 | HR 88 | Wt 177.0 lb

## 2021-08-16 DIAGNOSIS — Z3480 Encounter for supervision of other normal pregnancy, unspecified trimester: Secondary | ICD-10-CM

## 2021-08-16 MED ORDER — BLOOD PRESSURE KIT DEVI: 1.0000 | 0 refills | Status: AC

## 2021-08-16 NOTE — Progress Notes (Signed)
Subjective:  Kari Neal is a 20 y.o. G1P0 at 51w5dbeing seen today for ongoing prenatal care.  She is currently monitored for the following issues for this low-risk pregnancy and has Abdominal pain, recurrent; Serum total bilirubin elevated; Supervision of other normal pregnancy, antepartum; Asthma; Nausea and vomiting in pregnancy; Anemia affecting pregnancy, antepartum; Low thyroid stimulating hormone (TSH) level; and BMI 35.0-35.9,adult on their problem list.  Patient reports backache.  Contractions: Not present. Vag. Bleeding: None.  Movement: Present. Denies leaking of fluid.   The following portions of the patient's history were reviewed and updated as appropriate: allergies, current medications, past family history, past medical history, past social history, past surgical history and problem list. Problem list updated.  Objective:   Vitals:   08/16/21 1107  BP: 115/71  Pulse: 88  Weight: 177 lb (80.3 kg)    Fetal Status: Fetal Heart Rate (bpm): 140   Movement: Present     General:  Alert, oriented and cooperative. Patient is in no acute distress.  Skin: Skin is warm and dry. No rash noted.   Cardiovascular: Normal heart rate noted  Respiratory: Normal respiratory effort, no problems with respiration noted  Abdomen: Soft, gravid, appropriate for gestational age. Pain/Pressure: Present     Pelvic:  Cervical exam deferred        Extremities: Normal range of motion.  Edema: None  Mental Status: Normal mood and affect. Normal behavior. Normal judgment and thought content.   Urinalysis:      Assessment and Plan:  Pregnancy: G1P0 at 263w5d1. Supervision of other normal pregnancy, antepartum   - Blood Pressure Monitoring (BLOOD PRESSURE KIT) DEVI; 1 kit by Does not apply route once a week. Check Blood Pressure regularly and record readings into the [employer] App.  Large Cuff.  DX O90.0  Dispense: 1 each; Refill: 0  Preterm labor symptoms and general obstetric precautions  including but not limited to vaginal bleeding, contractions, leaking of fluid and fetal movement were reviewed in detail with the patient. Please refer to After Visit Summary for other counseling recommendations.   Return in about 2 weeks (around 08/30/2021) for ROB, 2 hour OGTT.   HaShelly BombardMD  08/16/21

## 2021-08-19 ENCOUNTER — Ambulatory Visit: Payer: Medicaid Other | Attending: Obstetrics and Gynecology

## 2021-08-19 ENCOUNTER — Ambulatory Visit: Payer: Medicaid Other | Admitting: *Deleted

## 2021-08-19 ENCOUNTER — Other Ambulatory Visit: Payer: Self-pay

## 2021-08-19 ENCOUNTER — Encounter: Payer: Self-pay | Admitting: *Deleted

## 2021-08-19 ENCOUNTER — Other Ambulatory Visit: Payer: Self-pay | Admitting: Obstetrics and Gynecology

## 2021-08-19 VITALS — BP 121/70 | HR 92

## 2021-08-19 DIAGNOSIS — J45909 Unspecified asthma, uncomplicated: Secondary | ICD-10-CM | POA: Diagnosis present

## 2021-08-19 DIAGNOSIS — O99212 Obesity complicating pregnancy, second trimester: Secondary | ICD-10-CM

## 2021-08-19 DIAGNOSIS — O219 Vomiting of pregnancy, unspecified: Secondary | ICD-10-CM

## 2021-08-19 DIAGNOSIS — R17 Unspecified jaundice: Secondary | ICD-10-CM | POA: Insufficient documentation

## 2021-08-19 DIAGNOSIS — Z3A27 27 weeks gestation of pregnancy: Secondary | ICD-10-CM

## 2021-08-19 DIAGNOSIS — O99512 Diseases of the respiratory system complicating pregnancy, second trimester: Secondary | ICD-10-CM

## 2021-08-19 DIAGNOSIS — O09892 Supervision of other high risk pregnancies, second trimester: Secondary | ICD-10-CM | POA: Diagnosis present

## 2021-08-19 DIAGNOSIS — O99012 Anemia complicating pregnancy, second trimester: Secondary | ICD-10-CM | POA: Diagnosis not present

## 2021-08-19 DIAGNOSIS — E669 Obesity, unspecified: Secondary | ICD-10-CM

## 2021-08-19 DIAGNOSIS — O365920 Maternal care for other known or suspected poor fetal growth, second trimester, not applicable or unspecified: Secondary | ICD-10-CM | POA: Diagnosis not present

## 2021-08-19 DIAGNOSIS — D649 Anemia, unspecified: Secondary | ICD-10-CM

## 2021-08-20 ENCOUNTER — Other Ambulatory Visit: Payer: Self-pay | Admitting: *Deleted

## 2021-08-20 ENCOUNTER — Encounter: Payer: Self-pay | Admitting: Obstetrics

## 2021-08-20 DIAGNOSIS — E038 Other specified hypothyroidism: Secondary | ICD-10-CM

## 2021-08-20 DIAGNOSIS — O365920 Maternal care for other known or suspected poor fetal growth, second trimester, not applicable or unspecified: Secondary | ICD-10-CM

## 2021-08-20 DIAGNOSIS — O09892 Supervision of other high risk pregnancies, second trimester: Secondary | ICD-10-CM

## 2021-08-30 ENCOUNTER — Ambulatory Visit (INDEPENDENT_AMBULATORY_CARE_PROVIDER_SITE_OTHER): Payer: Medicaid Other | Admitting: Obstetrics

## 2021-08-30 ENCOUNTER — Other Ambulatory Visit: Payer: Medicaid Other

## 2021-08-30 ENCOUNTER — Encounter: Payer: Self-pay | Admitting: Obstetrics

## 2021-08-30 ENCOUNTER — Other Ambulatory Visit: Payer: Self-pay

## 2021-08-30 VITALS — BP 116/73 | HR 92 | Wt 178.0 lb

## 2021-08-30 DIAGNOSIS — Z3480 Encounter for supervision of other normal pregnancy, unspecified trimester: Secondary | ICD-10-CM

## 2021-08-30 NOTE — Progress Notes (Signed)
OB/GTT.  Declined TDAP and FLU vaccines.

## 2021-08-30 NOTE — Progress Notes (Signed)
Subjective:  Kari Neal is a 20 y.o. G1P0 at [redacted]w[redacted]d being seen today for ongoing prenatal care.  She is currently monitored for the following issues for this low-risk pregnancy and has Abdominal pain, recurrent; Serum total bilirubin elevated; Supervision of other normal pregnancy, antepartum; Asthma; Nausea and vomiting in pregnancy; Anemia affecting pregnancy, antepartum; Low thyroid stimulating hormone (TSH) level; and BMI 35.0-35.9,adult on their problem list.  Patient reports backache.  Contractions: Not present. Vag. Bleeding: None.  Movement: Present. Denies leaking of fluid.   The following portions of the patient's history were reviewed and updated as appropriate: allergies, current medications, past family history, past medical history, past social history, past surgical history and problem list. Problem list updated.  Objective:   Vitals:   08/30/21 0909  BP: 116/73  Pulse: 92  Weight: 178 lb (80.7 kg)    Fetal Status: Fetal Heart Rate (bpm): 153   Movement: Present     General:  Alert, oriented and cooperative. Patient is in no acute distress.  Skin: Skin is warm and dry. No rash noted.   Cardiovascular: Normal heart rate noted  Respiratory: Normal respiratory effort, no problems with respiration noted  Abdomen: Soft, gravid, appropriate for gestational age. Pain/Pressure: Absent     Pelvic:  Cervical exam deferred        Extremities: Normal range of motion.  Edema: Trace  Mental Status: Normal mood and affect. Normal behavior. Normal judgment and thought content.   Urinalysis:      Assessment and Plan:  Pregnancy: G1P0 at [redacted]w[redacted]d  1. Supervision of other normal pregnancy, antepartum Rx: - Glucose Tolerance, 2 Hours w/1 Hour - RPR - CBC - HIV antibody (with reflex)  Preterm labor symptoms and general obstetric precautions including but not limited to vaginal bleeding, contractions, leaking of fluid and fetal movement were reviewed in detail with the patient. Please  refer to After Visit Summary for other counseling recommendations.   Return in about 2 weeks (around 09/13/2021) for ROB.   Brock Bad, MD  08/30/21

## 2021-08-31 LAB — CBC
Hematocrit: 26.1 % — ABNORMAL LOW (ref 34.0–46.6)
Hemoglobin: 8.8 g/dL — ABNORMAL LOW (ref 11.1–15.9)
MCH: 32.1 pg (ref 26.6–33.0)
MCHC: 33.7 g/dL (ref 31.5–35.7)
MCV: 95 fL (ref 79–97)
Platelets: 267 10*3/uL (ref 150–450)
RBC: 2.74 x10E6/uL — CL (ref 3.77–5.28)
RDW: 16.5 % — ABNORMAL HIGH (ref 11.7–15.4)
WBC: 11.8 10*3/uL — ABNORMAL HIGH (ref 3.4–10.8)

## 2021-08-31 LAB — GLUCOSE TOLERANCE, 2 HOURS W/ 1HR
Glucose, 1 hour: 53 mg/dL — ABNORMAL LOW (ref 65–179)
Glucose, 2 hour: 50 mg/dL — ABNORMAL LOW (ref 65–152)
Glucose, Fasting: 69 mg/dL (ref 65–91)

## 2021-08-31 LAB — HIV ANTIBODY (ROUTINE TESTING W REFLEX): HIV Screen 4th Generation wRfx: NONREACTIVE

## 2021-08-31 LAB — RPR: RPR Ser Ql: NONREACTIVE

## 2021-09-03 ENCOUNTER — Other Ambulatory Visit: Payer: Self-pay | Admitting: Obstetrics and Gynecology

## 2021-09-03 ENCOUNTER — Encounter: Payer: Self-pay | Admitting: *Deleted

## 2021-09-03 ENCOUNTER — Other Ambulatory Visit: Payer: Self-pay

## 2021-09-03 ENCOUNTER — Ambulatory Visit: Payer: Medicaid Other | Attending: Obstetrics and Gynecology

## 2021-09-03 ENCOUNTER — Other Ambulatory Visit: Payer: Self-pay | Admitting: Obstetrics

## 2021-09-03 ENCOUNTER — Other Ambulatory Visit: Payer: Self-pay | Admitting: *Deleted

## 2021-09-03 ENCOUNTER — Ambulatory Visit: Payer: Medicaid Other | Admitting: *Deleted

## 2021-09-03 VITALS — BP 126/74 | HR 98

## 2021-09-03 DIAGNOSIS — O99213 Obesity complicating pregnancy, third trimester: Secondary | ICD-10-CM | POA: Diagnosis not present

## 2021-09-03 DIAGNOSIS — E669 Obesity, unspecified: Secondary | ICD-10-CM

## 2021-09-03 DIAGNOSIS — O219 Vomiting of pregnancy, unspecified: Secondary | ICD-10-CM | POA: Insufficient documentation

## 2021-09-03 DIAGNOSIS — O09892 Supervision of other high risk pregnancies, second trimester: Secondary | ICD-10-CM | POA: Insufficient documentation

## 2021-09-03 DIAGNOSIS — E038 Other specified hypothyroidism: Secondary | ICD-10-CM | POA: Insufficient documentation

## 2021-09-03 DIAGNOSIS — O365920 Maternal care for other known or suspected poor fetal growth, second trimester, not applicable or unspecified: Secondary | ICD-10-CM | POA: Diagnosis not present

## 2021-09-03 DIAGNOSIS — Z3A29 29 weeks gestation of pregnancy: Secondary | ICD-10-CM

## 2021-09-03 DIAGNOSIS — R17 Unspecified jaundice: Secondary | ICD-10-CM

## 2021-09-03 DIAGNOSIS — O09893 Supervision of other high risk pregnancies, third trimester: Secondary | ICD-10-CM

## 2021-09-03 DIAGNOSIS — O365931 Maternal care for other known or suspected poor fetal growth, third trimester, fetus 1: Secondary | ICD-10-CM

## 2021-09-03 DIAGNOSIS — O365930 Maternal care for other known or suspected poor fetal growth, third trimester, not applicable or unspecified: Secondary | ICD-10-CM | POA: Diagnosis not present

## 2021-09-03 MED ORDER — SODIUM CHLORIDE 0.9 % IV SOLN: 510.0000 mg | INTRAVENOUS | Status: AC

## 2021-09-03 NOTE — Progress Notes (Signed)
Notify patient of severe anemia.  Ferraheme ordered.  Please schedule.

## 2021-09-10 ENCOUNTER — Ambulatory Visit: Payer: Medicaid Other | Admitting: *Deleted

## 2021-09-10 ENCOUNTER — Ambulatory Visit (INDEPENDENT_AMBULATORY_CARE_PROVIDER_SITE_OTHER): Payer: Medicaid Other | Admitting: Advanced Practice Midwife

## 2021-09-10 ENCOUNTER — Ambulatory Visit: Payer: Medicaid Other | Attending: Obstetrics and Gynecology

## 2021-09-10 ENCOUNTER — Ambulatory Visit: Payer: Medicaid Other

## 2021-09-10 ENCOUNTER — Ambulatory Visit (HOSPITAL_BASED_OUTPATIENT_CLINIC_OR_DEPARTMENT_OTHER): Payer: Medicaid Other | Admitting: *Deleted

## 2021-09-10 ENCOUNTER — Other Ambulatory Visit: Payer: Self-pay

## 2021-09-10 VITALS — BP 119/74 | HR 93 | Wt 176.0 lb

## 2021-09-10 VITALS — BP 128/71 | HR 92

## 2021-09-10 DIAGNOSIS — R7989 Other specified abnormal findings of blood chemistry: Secondary | ICD-10-CM

## 2021-09-10 DIAGNOSIS — Z3A30 30 weeks gestation of pregnancy: Secondary | ICD-10-CM

## 2021-09-10 DIAGNOSIS — O09893 Supervision of other high risk pregnancies, third trimester: Secondary | ICD-10-CM

## 2021-09-10 DIAGNOSIS — O26899 Other specified pregnancy related conditions, unspecified trimester: Secondary | ICD-10-CM

## 2021-09-10 DIAGNOSIS — O365920 Maternal care for other known or suspected poor fetal growth, second trimester, not applicable or unspecified: Secondary | ICD-10-CM | POA: Diagnosis present

## 2021-09-10 DIAGNOSIS — R109 Unspecified abdominal pain: Secondary | ICD-10-CM

## 2021-09-10 DIAGNOSIS — O09892 Supervision of other high risk pregnancies, second trimester: Secondary | ICD-10-CM | POA: Insufficient documentation

## 2021-09-10 DIAGNOSIS — O219 Vomiting of pregnancy, unspecified: Secondary | ICD-10-CM

## 2021-09-10 DIAGNOSIS — R17 Unspecified jaundice: Secondary | ICD-10-CM

## 2021-09-10 DIAGNOSIS — E038 Other specified hypothyroidism: Secondary | ICD-10-CM | POA: Diagnosis not present

## 2021-09-10 DIAGNOSIS — O99019 Anemia complicating pregnancy, unspecified trimester: Secondary | ICD-10-CM

## 2021-09-10 DIAGNOSIS — O365930 Maternal care for other known or suspected poor fetal growth, third trimester, not applicable or unspecified: Secondary | ICD-10-CM | POA: Diagnosis not present

## 2021-09-10 DIAGNOSIS — Z3480 Encounter for supervision of other normal pregnancy, unspecified trimester: Secondary | ICD-10-CM

## 2021-09-10 NOTE — Progress Notes (Signed)
+   Fetal movement. No complaints.  

## 2021-09-10 NOTE — Procedures (Signed)
Kari Neal 25-Mar-2001 [redacted]w[redacted]d  Fetus A Non-Stress Test Interpretation for 09/10/21  Indication: IUGR  Fetal Heart Rate A Mode: External Baseline Rate (A): 145 bpm Variability: Moderate Accelerations: 15 x 15 Decelerations: None Multiple birth?: No  Uterine Activity Mode: Palpation, Toco Contraction Frequency (min): None Resting Tone Palpated: Relaxed Resting Time: Adequate  Interpretation (Fetal Testing) Nonstress Test Interpretation: Reactive Comments: Dr. Judeth Cornfield reviewed tracing.

## 2021-09-10 NOTE — Progress Notes (Signed)
   PRENATAL VISIT NOTE  Subjective:  Kari Neal is a 20 y.o. G1P0 at [redacted]w[redacted]d being seen today for ongoing prenatal care.  She is currently monitored for the following issues for this low-risk pregnancy and has Abdominal pain, recurrent; Serum total bilirubin elevated; Supervision of other normal pregnancy, antepartum; Asthma; Nausea and vomiting in pregnancy; Anemia affecting pregnancy, antepartum; Low thyroid stimulating hormone (TSH) level; and BMI 35.0-35.9,adult on their problem list.  Patient reports occasional contractions.  Contractions: Not present. Vag. Bleeding: None.  Movement: Present. Denies leaking of fluid.   The following portions of the patient's history were reviewed and updated as appropriate: allergies, current medications, past family history, past medical history, past social history, past surgical history and problem list.   Objective:   Vitals:   09/10/21 1554  BP: 119/74  Pulse: 93  Weight: 176 lb (79.8 kg)    Fetal Status: Fetal Heart Rate (bpm): 144   Movement: Present     General:  Alert, oriented and cooperative. Patient is in no acute distress.  Skin: Skin is warm and dry. No rash noted.   Cardiovascular: Normal heart rate noted  Respiratory: Normal respiratory effort, no problems with respiration noted  Abdomen: Soft, gravid, appropriate for gestational age.  Pain/Pressure: Absent     Pelvic: Cervical exam deferred        Extremities: Normal range of motion.  Edema: None  Mental Status: Normal mood and affect. Normal behavior. Normal judgment and thought content.   Assessment and Plan:  Pregnancy: G1P0 at [redacted]w[redacted]d 1. Low thyroid stimulating hormone (TSH) level --Subclinical hyperthyroid. Labs monthly per MFM.  - TSH - T4, free  2. Supervision of other normal pregnancy, antepartum --Anticipatory guidance about next visits/weeks of pregnancy given. --Next visit in 2 weeks  3. Anemia affecting pregnancy, antepartum --No s/sx, taking oral iron  4.  [redacted] weeks gestation of pregnancy   5. 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 Pain improves when not standing.  Wearing maternity support belt when working.  Preterm labor symptoms and general obstetric precautions including but not limited to vaginal bleeding, contractions, leaking of fluid and fetal movement were reviewed in detail with the patient. Please refer to After Visit Summary for other counseling recommendations.   No follow-ups on file.  Future Appointments  Date Time Provider Department Center  09/17/2021  3:15 PM [facility] NURSE [facility]  09/17/2021  3:30 PM [facility] US3 [facility]  09/17/2021  4:00 PM [facility] NST [facility]  09/24/2021  2:15 PM [facility]URSE [facility]  09/24/2021  2:30 PM [facility] US3 [facility]  09/24/2021  3:15 PM [facility] NST [facility]  10/01/2021  2:15 PM [facility]SE [facility]  10/01/2021  2:30 PM [facility] US3 [facility]  10/01/2021  3:15 PM [facility] NST [facility]  10/08/2021  2:00 PM [facility] NURSE [facility]  10/08/2021  2:15 PM [facility] US2 [facility]  10/08/2021  3:15 PM [facility] NST [facility]    Sharen Counter, CNM

## 2021-09-11 LAB — T4, FREE: Free T4: 1.09 ng/dL (ref 0.93–1.60)

## 2021-09-11 LAB — TSH: TSH: 0.036 u[IU]/mL — ABNORMAL LOW (ref 0.450–4.500)

## 2021-09-17 ENCOUNTER — Encounter: Payer: Self-pay | Admitting: *Deleted

## 2021-09-17 ENCOUNTER — Ambulatory Visit: Payer: Medicaid Other | Admitting: *Deleted

## 2021-09-17 ENCOUNTER — Other Ambulatory Visit: Payer: Self-pay

## 2021-09-17 ENCOUNTER — Ambulatory Visit: Payer: Medicaid Other

## 2021-09-17 ENCOUNTER — Ambulatory Visit: Payer: Medicaid Other | Attending: Obstetrics and Gynecology

## 2021-09-17 VITALS — BP 126/70 | HR 102

## 2021-09-17 DIAGNOSIS — J45909 Unspecified asthma, uncomplicated: Secondary | ICD-10-CM

## 2021-09-17 DIAGNOSIS — O99013 Anemia complicating pregnancy, third trimester: Secondary | ICD-10-CM | POA: Diagnosis not present

## 2021-09-17 DIAGNOSIS — R17 Unspecified jaundice: Secondary | ICD-10-CM | POA: Diagnosis present

## 2021-09-17 DIAGNOSIS — Z3A31 31 weeks gestation of pregnancy: Secondary | ICD-10-CM

## 2021-09-17 DIAGNOSIS — O99333 Smoking (tobacco) complicating pregnancy, third trimester: Secondary | ICD-10-CM

## 2021-09-17 DIAGNOSIS — O365931 Maternal care for other known or suspected poor fetal growth, third trimester, fetus 1: Secondary | ICD-10-CM | POA: Insufficient documentation

## 2021-09-17 DIAGNOSIS — O219 Vomiting of pregnancy, unspecified: Secondary | ICD-10-CM

## 2021-09-17 DIAGNOSIS — O365930 Maternal care for other known or suspected poor fetal growth, third trimester, not applicable or unspecified: Secondary | ICD-10-CM

## 2021-09-17 DIAGNOSIS — E669 Obesity, unspecified: Secondary | ICD-10-CM

## 2021-09-17 DIAGNOSIS — O99213 Obesity complicating pregnancy, third trimester: Secondary | ICD-10-CM

## 2021-09-24 ENCOUNTER — Ambulatory Visit (HOSPITAL_BASED_OUTPATIENT_CLINIC_OR_DEPARTMENT_OTHER): Payer: Medicaid Other | Admitting: *Deleted

## 2021-09-24 ENCOUNTER — Other Ambulatory Visit: Payer: Self-pay

## 2021-09-24 ENCOUNTER — Ambulatory Visit: Payer: Medicaid Other | Admitting: *Deleted

## 2021-09-24 ENCOUNTER — Ambulatory Visit: Payer: Medicaid Other | Attending: Obstetrics and Gynecology

## 2021-09-24 VITALS — BP 123/68 | HR 93

## 2021-09-24 DIAGNOSIS — E669 Obesity, unspecified: Secondary | ICD-10-CM

## 2021-09-24 DIAGNOSIS — R17 Unspecified jaundice: Secondary | ICD-10-CM

## 2021-09-24 DIAGNOSIS — O99213 Obesity complicating pregnancy, third trimester: Secondary | ICD-10-CM | POA: Diagnosis not present

## 2021-09-24 DIAGNOSIS — Z3A32 32 weeks gestation of pregnancy: Secondary | ICD-10-CM | POA: Diagnosis not present

## 2021-09-24 DIAGNOSIS — O99333 Smoking (tobacco) complicating pregnancy, third trimester: Secondary | ICD-10-CM | POA: Diagnosis not present

## 2021-09-24 DIAGNOSIS — O219 Vomiting of pregnancy, unspecified: Secondary | ICD-10-CM | POA: Insufficient documentation

## 2021-09-24 DIAGNOSIS — O365930 Maternal care for other known or suspected poor fetal growth, third trimester, not applicable or unspecified: Secondary | ICD-10-CM

## 2021-09-24 DIAGNOSIS — O365931 Maternal care for other known or suspected poor fetal growth, third trimester, fetus 1: Secondary | ICD-10-CM | POA: Diagnosis present

## 2021-09-24 NOTE — Procedures (Signed)
Kari Neal 2001/04/11 [redacted]w[redacted]d  Fetus A Non-Stress Test Interpretation for 09/24/21  Indication: IUGR  Fetal Heart Rate A Mode: External Baseline Rate (A): 135 bpm Variability: Moderate Accelerations: 15 x 15 Decelerations: None Multiple birth?: No  Uterine Activity Mode: Palpation, Toco Contraction Frequency (min): none Resting Tone Palpated: Relaxed  Interpretation (Fetal Testing) Nonstress Test Interpretation: Reactive Overall Impression: Reassuring for gestational age Comments: Dr. Grace Bushy reviewed tracing

## 2021-09-25 ENCOUNTER — Ambulatory Visit (INDEPENDENT_AMBULATORY_CARE_PROVIDER_SITE_OTHER): Payer: Medicaid Other | Admitting: Women's Health

## 2021-09-25 VITALS — BP 126/74 | HR 103 | Wt 177.0 lb

## 2021-09-25 DIAGNOSIS — Z3480 Encounter for supervision of other normal pregnancy, unspecified trimester: Secondary | ICD-10-CM

## 2021-09-25 DIAGNOSIS — Z3A32 32 weeks gestation of pregnancy: Secondary | ICD-10-CM

## 2021-09-25 DIAGNOSIS — O99019 Anemia complicating pregnancy, unspecified trimester: Secondary | ICD-10-CM

## 2021-09-25 DIAGNOSIS — R7989 Other specified abnormal findings of blood chemistry: Secondary | ICD-10-CM

## 2021-09-25 DIAGNOSIS — R17 Unspecified jaundice: Secondary | ICD-10-CM

## 2021-09-25 DIAGNOSIS — O365931 Maternal care for other known or suspected poor fetal growth, third trimester, fetus 1: Secondary | ICD-10-CM

## 2021-09-25 DIAGNOSIS — R12 Heartburn: Secondary | ICD-10-CM

## 2021-09-25 DIAGNOSIS — O26893 Other specified pregnancy related conditions, third trimester: Secondary | ICD-10-CM | POA: Insufficient documentation

## 2021-09-25 DIAGNOSIS — O365990 Maternal care for other known or suspected poor fetal growth, unspecified trimester, not applicable or unspecified: Secondary | ICD-10-CM | POA: Insufficient documentation

## 2021-09-25 NOTE — Patient Instructions (Addendum)
Maternity Assessment Unit (MAU)  The Maternity Assessment Unit (MAU) is located at the [facility]. The address is: [address] [place], [place], [place] [address]. Please see map below for additional directions.    The Maternity Assessment Unit is designed to help you during your pregnancy, and for up to 6 weeks after delivery, with any pregnancy- or postpartum-related emergencies, if you think you are in labor, or if your water has broken. For example, if you experience nausea and vomiting, vaginal bleeding, severe abdominal or pelvic pain, elevated blood pressure or other problems related to your pregnancy or postpartum time, please come to the Maternity Assessment Unit for assistance.       Childbirth Education Options: [facility] Department Classes:  Childbirth education classes can help you get ready for a positive parenting experience. You can also meet other expectant parents and get free stuff for your baby. Each class runs for five weeks on the same night and costs $45 for the mother-to-be and her support person. Medicaid covers the cost if you are eligible. Call [phone] to register. [facility] Childbirth Education: Classes can vary in availability and schedule is subject to change. For most up-to-date information please visit [email or web address] to review and register.             Preterm Labor The normal length of a pregnancy is 39-41 weeks. Preterm labor is when labor starts before 37 completed weeks of pregnancy. Babies who are born prematurely and survive may not be fully developed and may be at an increased risk for long-term problems such as cerebral palsy, developmental delays, and vision and hearing problems. Babies who are born too early may have problems soon after birth. Premature babies may have problems regulating blood sugar, body temperature, heart rate, and breathing  rate. These babies often have trouble with feeding. The risk of having problems is highest for babies who are born before 34 weeks of pregnancy. What are the causes? The exact cause of this condition is not known. What increases the risk? You are more likely to have preterm labor if you have certain risk factors that relate to your medical history, problems with present and past pregnancies, and lifestyle factors. Medical history You have abnormalities of the uterus, including a short cervix. You have STIs (sexually transmitted infections) or other infections of the urinary tract and the vagina. You have chronic illnesses, such as blood clotting problems, diabetes, or high blood pressure. You are overweight or underweight. Present and past pregnancies You have had preterm labor before. You are pregnant with twins or other multiples. You have been diagnosed with a condition in which the placenta covers your cervix (placenta previa). You waited less than 18 months between giving birth and becoming pregnant again. Your unborn baby has some abnormalities. You have vaginal bleeding during pregnancy. You became pregnant through in vitro fertilization (IVF). Lifestyle and environmental factors You use tobacco products or drink alcohol. You use drugs. You have stress and no social support. You experience domestic violence. You are exposed to certain chemicals or environmental pollutants. Other factors You are younger than age 83 or older than age 36. What are the signs or symptoms? Symptoms of this condition include: Cramps similar to those that can happen during a menstrual period. The cramps may happen with diarrhea. Pain in the abdomen or lower back. Regular contractions that may feel like tightening of the abdomen. A feeling of increased pressure in  the pelvis. Increased watery or bloody mucus discharge from the vagina. Water breaking (ruptured amniotic sac). How is this  diagnosed? This condition is diagnosed based on: Your medical history and a physical exam. A pelvic exam. An ultrasound. Monitoring your uterus for contractions. Other tests, including: A swab of the cervix to check for a chemical called fetal fibronectin. Urine tests. How is this treated? Treatment for this condition depends on the length of your pregnancy, your condition, and the health of your baby. Treatment may include: Taking medicines, such as: Hormone medicines. These may be given early in pregnancy to help support the pregnancy. Medicines to stop contractions. Medicines to help mature the baby's lungs. These may be prescribed if the risk of delivery is high. Medicines to help protect your baby from brain and nerve complications such as cerebral palsy. Bed rest. If the labor happens before 34 weeks of pregnancy, you may need to stay in the hospital. Delivery of the baby. Follow these instructions at home:  Do not use any products that contain nicotine or tobacco. These products include cigarettes, chewing tobacco, and vaping devices, such as e-cigarettes. If you need help quitting, ask your health care provider. Do not drink alcohol. Take over-the-counter and prescription medicines only as told by your health care provider. Rest as told by your health care provider. Return to your normal activities as told by your health care provider. Ask your health care provider what activities are safe for you. Keep all follow-up visits. This is important. How is this prevented? To increase your chance of having a full-term pregnancy: Do not use drugs or take medicines that have not been prescribed to you during your pregnancy. Talk with your health care provider before taking any herbal supplements, even if you have been taking them regularly. Make sure you gain a healthy amount of weight during your pregnancy. Watch for infection. If you think that you might have an infection, get it  checked right away. Symptoms of infection may include: Fever. Abnormal vaginal discharge or discharge that smells bad. Pain or burning with urination. Needing to urinate urgently. Frequently urinating or passing small amounts of urine frequently. Blood in your urine or urine that smells bad or unusual. Where to find more information U.S. Department of Health and Cytogeneticist on Women's Health: http://hoffman.com/ [employer]ians [employer]necologists: www.acog.org Centers for Disease Control and Prevention, Preterm Birth: FootballExhibition.com.br Contact a health care provider if: You think you are going into preterm labor. You have signs or symptoms of preterm labor. You have symptoms of infection. Get help right away if: You are having regular, painful contractions every 5 minutes or less. Your water breaks. Summary Preterm labor is labor that starts before you reach 37 weeks of pregnancy. Delivering your baby early increases your baby's risk of developing long-term problems. You are more likely to have preterm labor if you have certain risk factors that relate to your medical history, problems with present and past pregnancies, and lifestyle factors. Keep all follow-up visits. This is important. Contact a health care provider if you have signs or symptoms of preterm labor. This information is not intended to replace advice given to you by your health care provider. Make sure you discuss any questions you have with your health care provider. Document Revised: 11/13/2020 Document Reviewed: 11/13/2020 Elsevier Patient Education  2022 Elsevier Inc.       Heartburn During Pregnancy Heartburn is a type of pain or discomfort that can happen in  the throat or chest. It is often described as a burning sensation. Heartburn is common during pregnancy because: Progesterone, a hormone that is released during pregnancy, may relax the valve that separates the esophagus from the  stomach (lower esophageal sphincter, or LES). This allows stomach acid to move up into the esophagus, causing heartburn. The uterus gets larger and pushes up on the stomach, which pushes more acid into the esophagus. This is especially true in the later stages of pregnancy. Heartburn usually goes away or gets better after giving birth. What are the causes? This condition is caused by stomach acid backing up into the esophagus (reflux). Reflux can be triggered by: Changing hormone levels. Large meals. Certain foods and beverages, such as coffee, chocolate, onions, and peppermint. Exercise. Increased stomach acid production. What increases the risk? You are more likely to develop this condition if: You had heartburn prior to becoming pregnant. You have been pregnant more than once before. You are overweight or obese. The likelihood that you will get heartburn also increases as you get further along in your pregnancy, especially during the last trimester. What are the signs or symptoms? Symptoms of this condition include: Burning pain in the chest or lower throat. A bitter taste in the mouth. Coughing. Problems swallowing. Vomiting. A hoarse voice. Asthma. Symptoms may get worse when you lie down or bend over. Symptoms are often worse at night. How is this diagnosed? This condition is diagnosed based on: Your medical history. Your symptoms. Blood tests to check for a certain type of bacteria that is associated with heartburn. Whether taking heartburn medicine relieves your symptoms. An examination of the stomach and esophagus using a tube that has a light and camera (endoscopy). How is this treated? Treatment for this condition depends on how severe your symptoms are. Your health care provider may recommend: Over-the-counter medicines for mild heartburn, such as antacids or acid reducers. Prescription medicines to decrease stomach acid or to protect your stomach lining. Certain  changes in your diet. Raising the head of your bed so it is higher than the foot of the bed. This helps prevent stomach acid from backing up into the esophagus when you are lying down. Follow these instructions at home: Eating and drinking Do not drink alcohol during your pregnancy. Identify foods and beverages that make your symptoms worse and avoid them. Eat small, frequent meals instead of large meals. Avoid drinking large amounts of liquid with your meals. Avoid eating meals during the 2-3 hours before bedtime. Avoid lying down right after you eat. Do not exercise right after you eat. Beverages to avoid Coffee and tea, with or without caffeine. Energy drinks and sports drinks. Carbonated drinks or sodas. Citrus fruit juices. Foods to avoid Chocolate and cocoa. Peppermint and mint flavorings. Garlic, onions, and horseradish. Spicy and acidic foods, including peppers, chili powder, curry powder, vinegar, hot sauces, and barbecue sauce. Citrus fruits, such as oranges, lemons, and limes. Tomato-based foods, such as red sauce, chili, and salsa. Fried and fatty foods, such as donuts, french fries, potato chips, and high-fat dressings. High-fat meats, such as hot dogs, precooked or cured meat, sausage, ham, and bacon. High-fat dairy items, such as whole milk, butter, and cheese. Medicines Take over-the-counter and prescription medicines only as told by your health care provider. Do not take aspirin or NSAIDs, such as ibuprofen, unless your health care provider tells you to take them. You may be instructed to avoid medicines that contain sodium bicarbonate. General instructions If directed, raise the  head of your bed about 6 inches (15 cm) by putting blocks under the legs. Sleeping with more pillows does not effectively relieve heartburn because it only changes the position of your head. Do not use any products that contain nicotine or tobacco, such as cigarettes, e-cigarettes, and  chewing tobacco. If you need help quitting, ask your health care provider. Wear loose-fitting clothing. Try to reduce your stress, such as with yoga or meditation. If you need help managing stress, ask your health care provider. Maintain a healthy weight. If you are overweight, work with your health care provider to safely manage your weight. Keep all follow-up visits as told by your health care provider. This is important. Where to find more information American Pregnancy Association: americanpregnancy.org Contact a health care provider if: Your symptoms do not improve with treatment, or you develop new symptoms. You have unexplained weight loss. You have difficulty swallowing. You make loud sounds when you breathe (wheeze). You have a cough that does not go away. You have frequent heartburn for more than 2 weeks. You have nausea or vomiting that does not get better with treatment. You have pain in your abdomen. Get help right away if: You have severe chest pain that spreads to your arm, neck, or jaw. You feel sweaty, dizzy, or light-headed. You have shortness of breath. You have pain when swallowing. You vomit, and your vomit looks like blood or coffee grounds. Your stool is bloody or black. Summary Heartburn in pregnancy is common, especially during the last trimester. This condition is caused by stomach acid backing up into the esophagus (reflux). This condition can be treated with medicines, changes to your diet, or elevating the head of your bed. Keep all follow-up visits as told by your health care provider. This is important. This information is not intended to replace advice given to you by your health care provider. Make sure you discuss any questions you have with your health care provider. Document Revised: 08/03/2019 Document Reviewed: 08/03/2019 Elsevier Patient Education  2022 Elsevier Inc.                        Safe Medications in Pregnancy    Acne: Benzoyl  Peroxide Salicylic Acid  Backache/Headache: Tylenol: 2 regular strength every 4 hours OR              2 Extra strength every 6 hours  Colds/Coughs/Allergies: Benadryl (alcohol free) 25 mg every 6 hours as needed Breath right strips Claritin Cepacol throat lozenges Chloraseptic throat spray Cold-Eeze- up to three times per day Cough drops, alcohol free Flonase (by prescription only) Guaifenesin Mucinex Robitussin DM (plain only, alcohol free) Saline nasal spray/drops Sudafed (pseudoephedrine) & Actifed ** use only after [redacted] weeks gestation and if you do not have high blood pressure Tylenol Vicks Vaporub Zinc lozenges Zyrtec   Constipation: Colace Ducolax suppositories Fleet enema Glycerin suppositories Metamucil Milk of magnesia Miralax Senokot Smooth move tea  Diarrhea: Kaopectate Imodium A-D  *NO pepto Bismol  Hemorrhoids: Anusol Anusol HC Preparation H Tucks  Indigestion: Tums Maalox Mylanta Zantac  Pepcid  Insomnia: Benadryl (alcohol free) 25mg  every 6 hours as needed Tylenol PM Unisom, no Gelcaps  Leg Cramps: Tums MagGel  Nausea/Vomiting:  Bonine Dramamine Emetrol Ginger extract Sea bands Meclizine  Nausea medication to take during pregnancy:  Unisom (doxylamine succinate 25 mg tablets) Take one tablet daily at bedtime. If symptoms are not adequately controlled, the dose can be increased to a maximum recommended dose of  two tablets daily (1/2 tablet in the morning, 1/2 tablet mid-afternoon and one at bedtime). Vitamin B6 100mg  tablets. Take one tablet twice a day (up to 200 mg per day).  Skin Rashes: Aveeno products Benadryl cream or 25mg  every 6 hours as needed Calamine Lotion 1% cortisone cream  Yeast infection: Gyne-lotrimin 7 Monistat 7   **If taking multiple medications, please check labels to avoid duplicating the same active ingredients **take medication as directed on the label ** Do not exceed 4000 mg of tylenol in  24 hours **Do not take medications that contain aspirin or ibuprofen

## 2021-09-25 NOTE — Progress Notes (Signed)
Subjective:  Kari Neal is a 20 y.o. G1P0 at [redacted]w[redacted]d being seen today for ongoing prenatal care.  She is currently monitored for the following issues for this high-risk pregnancy and has Abdominal pain, recurrent; Serum total bilirubin elevated; Supervision of other normal pregnancy, antepartum; Asthma; Nausea and vomiting in pregnancy; Anemia affecting pregnancy, antepartum; Low thyroid stimulating hormone (TSH) level; and BMI 35.0-35.9,adult on their problem list.  Patient reports no complaints.  Contractions: Irritability. Vag. Bleeding: None.  Movement: Present. Denies leaking of fluid.   The following portions of the patient's history were reviewed and updated as appropriate: allergies, current medications, past family history, past medical history, past social history, past surgical history and problem list. Problem list updated.  Objective:   Vitals:   09/25/21 1329  BP: 126/74  Pulse: (!) 103  Weight: 177 lb (80.3 kg)    Fetal Status: Fetal Heart Rate (bpm): 142   Movement: Present     General:  Alert, oriented and cooperative. Patient is in no acute distress.  Skin: Skin is warm and dry. No rash noted.   Cardiovascular: Normal heart rate noted  Respiratory: Normal respiratory effort, no problems with respiration noted  Abdomen: Soft, gravid, appropriate for gestational age. Pain/Pressure: Absent     Pelvic: Vag. Bleeding: None     Cervical exam deferred        Extremities: Normal range of motion.  Edema: Trace  Mental Status: Normal mood and affect. Normal behavior. Normal judgment and thought content.   Urinalysis:      Assessment and Plan:  Pregnancy: G1P0 at [redacted]w[redacted]d  1. Supervision of other normal pregnancy, antepartum -CBE info given, pt reports recently taking CBE class  2. Anemia affecting pregnancy, antepartum CBC Latest Ref Rng & Units 08/30/2021 06/21/2021 05/07/2021  WBC 3.4 - 10.8 x10E3/uL 11.8(H) 9.9 11.5(H)  Hemoglobin 11.1 - 15.9 g/dL 4.9(F) 0.2(O) 3.7(C)   Hematocrit 34.0 - 46.6 % 26.1(L) 25.1(L) 27.7(L)  Platelets 150 - 450 x10E3/uL 267 316 299  -fereheme ordered 09/03/2021, not scheduled for patient, will schedule today -patient on oral iron QOD  3. Low thyroid stimulating hormone (TSH) level -last checked 09/10/2021 (no intervention needed), next check mid-November  4. Serum total bilirubin elevated -referred to GI 03/2021  5. [redacted] weeks gestation of pregnancy  Preterm labor symptoms and general obstetric precautions including but not limited to vaginal bleeding, contractions, leaking of fluid and fetal movement were reviewed in detail with the patient. I discussed the assessment and treatment plan with the patient. The patient was provided an opportunity to ask questions and all were answered. The patient agreed with the plan and demonstrated an understanding of the instructions. The patient was advised to call back or seek an in-person office evaluation/go to MAU at [facility] for any urgent or concerning symptoms. Please refer to After Visit Summary for other counseling recommendations.  Return in about 2 weeks (around 10/09/2021) for in-person HOB/APP OK, patient needs scheduled for iron infusion ASAP orders entered by Dr. Clearance Coots.   Simar Pothier, Odie Sera, NP

## 2021-09-26 ENCOUNTER — Other Ambulatory Visit: Payer: Self-pay | Admitting: Obstetrics and Gynecology

## 2021-09-26 DIAGNOSIS — O365931 Maternal care for other known or suspected poor fetal growth, third trimester, fetus 1: Secondary | ICD-10-CM

## 2021-09-30 ENCOUNTER — Other Ambulatory Visit (HOSPITAL_COMMUNITY): Payer: Self-pay | Admitting: *Deleted

## 2021-10-01 ENCOUNTER — Ambulatory Visit: Payer: Medicaid Other | Admitting: *Deleted

## 2021-10-01 ENCOUNTER — Ambulatory Visit: Payer: Medicaid Other | Attending: Obstetrics and Gynecology

## 2021-10-01 ENCOUNTER — Other Ambulatory Visit: Payer: Self-pay | Admitting: Obstetrics

## 2021-10-01 ENCOUNTER — Other Ambulatory Visit: Payer: Self-pay

## 2021-10-01 ENCOUNTER — Encounter (HOSPITAL_COMMUNITY)
Admission: RE | Admit: 2021-10-01 | Discharge: 2021-10-01 | Disposition: A | Discharge status: Home / Self Care | Payer: Medicaid Other | Source: Ambulatory Visit | Attending: Obstetrics | Admitting: Obstetrics

## 2021-10-01 ENCOUNTER — Other Ambulatory Visit: Payer: Self-pay | Admitting: *Deleted

## 2021-10-01 ENCOUNTER — Encounter: Payer: Self-pay | Admitting: *Deleted

## 2021-10-01 VITALS — BP 124/76 | HR 107

## 2021-10-01 DIAGNOSIS — O26893 Other specified pregnancy related conditions, third trimester: Secondary | ICD-10-CM

## 2021-10-01 DIAGNOSIS — Z3A33 33 weeks gestation of pregnancy: Secondary | ICD-10-CM

## 2021-10-01 DIAGNOSIS — O99213 Obesity complicating pregnancy, third trimester: Secondary | ICD-10-CM

## 2021-10-01 DIAGNOSIS — R12 Heartburn: Secondary | ICD-10-CM | POA: Insufficient documentation

## 2021-10-01 DIAGNOSIS — E079 Disorder of thyroid, unspecified: Secondary | ICD-10-CM | POA: Diagnosis not present

## 2021-10-01 DIAGNOSIS — R17 Unspecified jaundice: Secondary | ICD-10-CM

## 2021-10-01 DIAGNOSIS — O365931 Maternal care for other known or suspected poor fetal growth, third trimester, fetus 1: Secondary | ICD-10-CM

## 2021-10-01 DIAGNOSIS — O365930 Maternal care for other known or suspected poor fetal growth, third trimester, not applicable or unspecified: Secondary | ICD-10-CM

## 2021-10-01 DIAGNOSIS — O219 Vomiting of pregnancy, unspecified: Secondary | ICD-10-CM

## 2021-10-01 DIAGNOSIS — E669 Obesity, unspecified: Secondary | ICD-10-CM | POA: Diagnosis not present

## 2021-10-01 DIAGNOSIS — Z3A00 Weeks of gestation of pregnancy not specified: Secondary | ICD-10-CM | POA: Insufficient documentation

## 2021-10-01 DIAGNOSIS — O99283 Endocrine, nutritional and metabolic diseases complicating pregnancy, third trimester: Secondary | ICD-10-CM | POA: Diagnosis not present

## 2021-10-01 DIAGNOSIS — O99013 Anemia complicating pregnancy, third trimester: Secondary | ICD-10-CM | POA: Insufficient documentation

## 2021-10-01 DIAGNOSIS — D6489 Other specified anemias: Secondary | ICD-10-CM | POA: Insufficient documentation

## 2021-10-01 MED ORDER — SODIUM CHLORIDE 0.9 % IV SOLN
510.0000 mg | INTRAVENOUS | Status: DC
  Administered 2021-10-01: 510 mg via INTRAVENOUS
  Filled 2021-10-01: qty 17

## 2021-10-01 MED ORDER — SODIUM CHLORIDE 0.9 % IV SOLN: 510.0000 mg | INTRAVENOUS | Status: AC

## 2021-10-01 NOTE — Procedures (Signed)
Kari Neal [birth year] [redacted]w[redacted]d  Fetus A Non-Stress Test Interpretation for 10/01/21  Indication: IUGR  Fetal Heart Rate A Mode: External Baseline Rate (A): 130 bpm Variability: Moderate Accelerations: 15 x 15 Decelerations: None Multiple birth?: No  Uterine Activity Mode: Palpation, Toco Contraction Frequency (min): none Resting Tone Palpated: Relaxed  Interpretation (Fetal Testing) Nonstress Test Interpretation: Reactive Overall Impression: Reassuring for gestational age Comments: Dr. Parke Poisson reviewed tracing

## 2021-10-02 ENCOUNTER — Other Ambulatory Visit: Payer: Self-pay | Admitting: Obstetrics and Gynecology

## 2021-10-02 DIAGNOSIS — O365931 Maternal care for other known or suspected poor fetal growth, third trimester, fetus 1: Secondary | ICD-10-CM

## 2021-10-03 ENCOUNTER — Other Ambulatory Visit: Payer: Self-pay | Admitting: *Deleted

## 2021-10-03 DIAGNOSIS — O365930 Maternal care for other known or suspected poor fetal growth, third trimester, not applicable or unspecified: Secondary | ICD-10-CM

## 2021-10-03 NOTE — Progress Notes (Unsigned)
S 

## 2021-10-08 ENCOUNTER — Encounter: Payer: Self-pay | Admitting: *Deleted

## 2021-10-08 ENCOUNTER — Other Ambulatory Visit: Payer: Self-pay

## 2021-10-08 ENCOUNTER — Ambulatory Visit: Payer: Medicaid Other | Attending: Obstetrics and Gynecology

## 2021-10-08 ENCOUNTER — Ambulatory Visit: Payer: Medicaid Other | Admitting: *Deleted

## 2021-10-08 VITALS — BP 128/77 | HR 103

## 2021-10-08 DIAGNOSIS — O99213 Obesity complicating pregnancy, third trimester: Secondary | ICD-10-CM

## 2021-10-08 DIAGNOSIS — E669 Obesity, unspecified: Secondary | ICD-10-CM | POA: Diagnosis not present

## 2021-10-08 DIAGNOSIS — R17 Unspecified jaundice: Secondary | ICD-10-CM

## 2021-10-08 DIAGNOSIS — Z3A34 34 weeks gestation of pregnancy: Secondary | ICD-10-CM | POA: Diagnosis not present

## 2021-10-08 DIAGNOSIS — O365931 Maternal care for other known or suspected poor fetal growth, third trimester, fetus 1: Secondary | ICD-10-CM | POA: Insufficient documentation

## 2021-10-08 DIAGNOSIS — O26893 Other specified pregnancy related conditions, third trimester: Secondary | ICD-10-CM | POA: Insufficient documentation

## 2021-10-08 DIAGNOSIS — O219 Vomiting of pregnancy, unspecified: Secondary | ICD-10-CM | POA: Insufficient documentation

## 2021-10-08 DIAGNOSIS — O365930 Maternal care for other known or suspected poor fetal growth, third trimester, not applicable or unspecified: Secondary | ICD-10-CM | POA: Diagnosis not present

## 2021-10-08 DIAGNOSIS — R12 Heartburn: Secondary | ICD-10-CM | POA: Diagnosis present

## 2021-10-08 NOTE — Procedures (Signed)
Kari Neal January 13, 2001 [redacted]w[redacted]d  Fetus A Non-Stress Test Interpretation for 10/08/21  Indication: IUGR  Fetal Heart Rate A Mode: External Baseline Rate (A): 135 bpm Variability: Moderate Accelerations: 15 x 15 Decelerations: None Multiple birth?: No  Uterine Activity Mode: Palpation, Toco Contraction Frequency (min): none Resting Tone Palpated: Relaxed  Interpretation (Fetal Testing) Nonstress Test Interpretation: Reactive Overall Impression: Reassuring for gestational age Comments: Dr. Parke Poisson reviewed tracing

## 2021-10-09 ENCOUNTER — Encounter: Payer: Self-pay | Admitting: Obstetrics & Gynecology

## 2021-10-09 ENCOUNTER — Ambulatory Visit (INDEPENDENT_AMBULATORY_CARE_PROVIDER_SITE_OTHER): Payer: Medicaid Other | Admitting: Obstetrics & Gynecology

## 2021-10-09 ENCOUNTER — Encounter (HOSPITAL_COMMUNITY)
Admission: RE | Admit: 2021-10-09 | Discharge: 2021-10-09 | Disposition: A | Discharge status: Home / Self Care | Payer: Medicaid Other | Source: Ambulatory Visit | Attending: Obstetrics | Admitting: Obstetrics

## 2021-10-09 VITALS — BP 134/80 | HR 109 | Wt 183.0 lb

## 2021-10-09 DIAGNOSIS — E079 Disorder of thyroid, unspecified: Secondary | ICD-10-CM

## 2021-10-09 DIAGNOSIS — O99283 Endocrine, nutritional and metabolic diseases complicating pregnancy, third trimester: Secondary | ICD-10-CM

## 2021-10-09 DIAGNOSIS — Z3A00 Weeks of gestation of pregnancy not specified: Secondary | ICD-10-CM | POA: Diagnosis not present

## 2021-10-09 DIAGNOSIS — O365931 Maternal care for other known or suspected poor fetal growth, third trimester, fetus 1: Secondary | ICD-10-CM

## 2021-10-09 DIAGNOSIS — O99013 Anemia complicating pregnancy, third trimester: Secondary | ICD-10-CM | POA: Diagnosis not present

## 2021-10-09 DIAGNOSIS — D6489 Other specified anemias: Secondary | ICD-10-CM | POA: Diagnosis not present

## 2021-10-09 DIAGNOSIS — O0993 Supervision of high risk pregnancy, unspecified, third trimester: Secondary | ICD-10-CM

## 2021-10-09 DIAGNOSIS — Z3A34 34 weeks gestation of pregnancy: Secondary | ICD-10-CM

## 2021-10-09 MED ORDER — SODIUM CHLORIDE 0.9 % IV SOLN
510.0000 mg | INTRAVENOUS | Status: DC
  Administered 2021-10-09: 510 mg via INTRAVENOUS
  Filled 2021-10-09: qty 510

## 2021-10-09 NOTE — Progress Notes (Signed)
PRENATAL VISIT NOTE  Subjective:  Kari Neal is a 20 y.o. G1P0 at [redacted]w[redacted]d being seen today for ongoing prenatal care.  She is currently monitored for the following issues for this high-risk pregnancy and has Abdominal pain, recurrent; Serum total bilirubin elevated; Supervision of high-risk pregnancy, third trimester; Asthma; Nausea and vomiting in pregnancy; Anemia affecting pregnancy, antepartum; Low thyroid stimulating hormone (TSH) level; BMI 35.0-35.9,adult; IUGR (intrauterine growth restriction) affecting care of mother; and Heartburn in pregnancy in third trimester on their problem list.  Patient reports no complaints.  Contractions: Irritability. Vag. Bleeding: None.  Movement: Present. Denies leaking of fluid.   The following portions of the patient's history were reviewed and updated as appropriate: allergies, current medications, past family history, past medical history, past social history, past surgical history and problem list.   Objective:   Vitals:   10/09/21 1334  BP: 134/80  Pulse: (!) 109  Weight: 183 lb (83 kg)    Fetal Status: Fetal Heart Rate (bpm): 145   Movement: Present     General:  Alert, oriented and cooperative. Patient is in no acute distress.  Skin: Skin is warm and dry. No rash noted.   Cardiovascular: Normal heart rate noted  Respiratory: Normal respiratory effort, no problems with respiration noted  Abdomen: Soft, gravid, appropriate for gestational age.  Pain/Pressure: Present     Pelvic: Cervical exam deferred        Extremities: Normal range of motion.  Edema: Trace  Mental Status: Normal mood and affect. Normal behavior. Normal judgment and thought content.   Imaging: [place] MFM FETAL BPP W/NONSTRESS  Result Date: 10/08/2021 ----------------------------------------------------------------------  OBSTETRICS REPORT                       (Signed Final 10/08/2021 03:38 pm) ---------------------------------------------------------------------- Patient  Info  ID #:       [id number]                          D.O.B.:  05/02/2001 (20 yrs)  Name:       Kari Neal                     Visit Date: 10/08/2021 12:50 pm ---------------------------------------------------------------------- Performed By  Attending:        Johnell Comings MD         Ref. Address:     [address]  [place]                                                             [place]  Performed By:     Jacob Moores BS        [facility] RV[facility]                                 [facility]                                                             [place] [facility]                                                             [facility]  Referred By:      [facility] ---------------------------------------------------------------------- Orders  #  Description                           Code        Ordered By  1  [place] MFM FETAL BPP                      [id number]     YU FANG     W/NONSTRESS  2  [place] MFM UA CORD DOPPLER                [id number]    Noble ----------------------------------------------------------------------  #  Order #                     Accession #                Episode #  1  [id number]                   [id number]                 [id number]  2  [id number]                   [id number]                 [id number] ---------------------------------------------------------------------- Indications  Maternal care for known or suspected poor      O36.5930  fetal growth, third trimester, not applicable or  unspecified IUGR  [redacted] weeks gestation of pregnancy                Z3A.34  Thyroid disease in pregnancy                   O99.280, A999333  Obesity complicating pregnancy, third  [id number]  trimester (pregravid BMI 35)  Asthma                                         O99.89  j45.909  LR NIPS/ Negative AFP/ Negative Horizon  Anemia during pregnancy in third trimester     123456  Medical complication of pregnancy (Elevated    O26.90  Bilirubin)  Tobacco use complicating pregnancy, third      O99.333  trimester (e-cig) ---------------------------------------------------------------------- Fetal Evaluation  Num Of Fetuses:         1  Fetal Heart Rate(bpm):  155  Cardiac Activity:       Observed  Presentation:           Cephalic  Placenta:               Anterior  P. Cord Insertion:      Previously Visualized  Amniotic Fluid  AFI FV:      Within normal limits  AFI Sum(cm)     %Tile       Largest Pocket(cm)  14.8            53          7.7  RUQ(cm)       RLQ(cm)       LUQ(cm)        LLQ(cm)  7.7           2.5           3.3            1.3 ---------------------------------------------------------------------- Biophysical Evaluation  Amniotic F.V:   Pocket => 2 cm             F. Tone:        Observed  F. Movement:    Observed                   N.S.T:          Reactive  F. Breathing:   Observed                   Score:          10/10 ---------------------------------------------------------------------- OB History  Blood Type:   O+  Gravidity:    1 ---------------------------------------------------------------------- Gestational Age  LMP:           34w 2d        Date:  02/10/21                 EDD:   11/17/21  Best:          34w 2d     Det. By:  LMP  (02/10/21)          EDD:   11/17/21 ---------------------------------------------------------------------- Anatomy  Cranium:               Previously seen        LVOT:                   Previously seen  Cavum:                 Previously seen        Aortic Arch:            Previously seen  Ventricles:            Appears normal  Ductal Arch:            Previously seen  Choroid Plexus:        Previously seen        Diaphragm:              Appears normal  Cerebellum:            Previously seen        Stomach:                Appears normal, left                                                                         sided  Posterior Fossa:       Previously seen        Abdomen:                Previously seen  Nuchal Fold:           Not applicable (Q000111Q    Abdominal Wall:         Previously seen                         wks GA)  Face:                  Orbits and profile     Cord Vessels:           Previously seen                         previously seen  Lips:                  Previously seen        Kidneys:                Appear normal  Palate:                Not well visualized    Bladder:                Appears normal  Thoracic:              Appears normal         Spine:                  Previously seen  Heart:                 Appears normal         Upper Extremities:      Visualized                         (4CH, axis, and                                previously                         situs)  RVOT:                  Previously seen  Lower Extremities:      Visualized                                                                        previously  Other:  Female gender prev seen. Heels/feet and open hands/5th digits,          Lenses, Nasal Bone, 3VV and 3VTV previously visualized.          Technicallly difficult due to advanced GA and maternal habitus. ---------------------------------------------------------------------- Doppler - Fetal Vessels  Umbilical Artery   S/D     %tile      RI    %tile      PI    %tile            ADFV    RDFV   2.76       65    0.64       72    0.96       70               No      No ---------------------------------------------------------------------- Cervix Uterus Adnexa  Cervix  Not visualized (advanced GA >24wks)  Uterus  No abnormality visualized.  Right Ovary  Within normal limits.  Left Ovary  Within normal limits.  Cul De Sac  No free fluid seen.  Adnexa  No abnormality visualized. ---------------------------------------------------------------------- Comments  This patient was seen for a BPP/NST due to fetal growth   restriction.  She denies any problems since her last exam and  reports feeling vigorous fetal movements throughout the day.  A biophysical profile performed today due to fetal growth  restriction was 10 out of 10 with a reactive NST.  Doppler studies of the umbilical arteries showed a normal  S/D ratio of 2.76.  There were no signs of absent or reversed  end-diastolic flow.  Another BPP and umbilical artery Doppler study was  scheduled in 1 week.  Should IUGR continue to be noted later in her pregnancy,  delivery will be recommended at between 37 to 38 weeks. ----------------------------------------------------------------------                   Johnell Comings, MD Electronically Signed Final Report   10/08/2021 03:38 pm ----------------------------------------------------------------------  [place] MFM UA CORD DOPPLER  Result Date: 10/08/2021 ----------------------------------------------------------------------  OBSTETRICS REPORT                       (Signed Final 10/08/2021 03:38 pm) ---------------------------------------------------------------------- Patient Info  ID #:       [id number]                          D.O.B.:  26-Feb-2001 (20 yrs)  Name:       Kari Neal                     Visit Date: 10/08/2021 12:50 pm ---------------------------------------------------------------------- Performed By  Attending:        Johnell Comings MD         Ref. Address:     [address]                                                             [address]                                                             [place]                                                             [address]  Performed By:     Tommie Raymond BS        Location[facility]                    RDMS RVT                                 [facility] at                                                             [facility]                                                              [facility]  Referred By:      [facility] ---------------------------------------------------------------------- Orders  #  Description                           Code        Ordered By  1  [place] MFM FETAL BPP                      19509.3     YU FANG     W/NONSTRESS  2  [place] MFM UA CORD DOPPLER                [id number]    YU FANG ----------------------------------------------------------------------  #  Order #                     Accession #                Episode #  1  [id number]                   [id number]  [id number]  2  [id number]                   [id number]                 [id number] ---------------------------------------------------------------------- Indications  Maternal care for known or suspected poor      O36.5930  fetal growth, third trimester, not applicable or  unspecified IUGR  [redacted] weeks gestation of pregnancy                Z3A.34  Thyroid disease in pregnancy                   O99.280, A999333  Obesity complicating pregnancy, third          O99.213  trimester (pregravid BMI 35)  Asthma                                         O99.89 j45.909  LR NIPS/ Negative AFP/ Negative Horizon  Anemia during pregnancy in third trimester     123456  Medical complication of pregnancy (Elevated    O26.90  Bilirubin)  Tobacco use complicating pregnancy, third      O99.333  trimester (e-cig) ---------------------------------------------------------------------- Fetal Evaluation  Num Of Fetuses:         1  Fetal Heart Rate(bpm):  155  Cardiac Activity:       Observed  Presentation:           Cephalic  Placenta:               Anterior  P. Cord Insertion:      Previously Visualized  Amniotic Fluid  AFI FV:      Within normal limits  AFI Sum(cm)     %Tile       Largest Pocket(cm)  14.8            53          7.7  RUQ(cm)       RLQ(cm)       LUQ(cm)        LLQ(cm)  7.7           2.5           3.3            1.3 ---------------------------------------------------------------------- Biophysical Evaluation  Amniotic  F.V:   Pocket => 2 cm             F. Tone:        Observed  F. Movement:    Observed                   N.S.T:          Reactive  F. Breathing:   Observed                   Score:          10/10 ---------------------------------------------------------------------- OB History  Blood Type:   O+  Gravidity:    1 ---------------------------------------------------------------------- Gestational Age  LMP:           34w 2d        Date:  02/10/21                 EDD:   11/17/21  Best:          34w 2d     Det. By:  LMP  (02/10/21)          EDD:   11/17/21 ---------------------------------------------------------------------- Anatomy  Cranium:               Previously seen        LVOT:                   Previously seen  Cavum:                 Previously seen        Aortic Arch:            Previously seen  Ventricles:            Appears normal         Ductal Arch:            Previously seen  Choroid Plexus:        Previously seen        Diaphragm:              Appears normal  Cerebellum:            Previously seen        Stomach:                Appears normal, left                                                                        sided  Posterior Fossa:       Previously seen        Abdomen:                Previously seen  Nuchal Fold:           Not applicable (Q000111Q    Abdominal Wall:         Previously seen                         wks GA)  Face:                  Orbits and profile     Cord Vessels:           Previously seen                         previously seen  Lips:                  Previously seen        Kidneys:                Appear normal  Palate:                Not well visualized    Bladder:                Appears normal  Thoracic:              Appears normal         Spine:                  Previously seen  Heart:  Appears normal         Upper Extremities:      Visualized                         (4CH, axis, and                                previously                         situs)  RVOT:                   Previously seen        Lower Extremities:      Visualized                                                                        previously  Other:  Female gender prev seen. Heels/feet and open hands/5th digits,          Lenses, Nasal Bone, 3VV and 3VTV previously visualized.          Technicallly difficult due to advanced GA and maternal habitus. ---------------------------------------------------------------------- Doppler - Fetal Vessels  Umbilical Artery   S/D     %tile      RI    %tile      PI    %tile            ADFV    RDFV   2.76       65    0.64       72    0.96       70               No      No ---------------------------------------------------------------------- Cervix Uterus Adnexa  Cervix  Not visualized (advanced GA >24wks)  Uterus  No abnormality visualized.  Right Ovary  Within normal limits.  Left Ovary  Within normal limits.  Cul De Sac  No free fluid seen.  Adnexa  No abnormality visualized. ---------------------------------------------------------------------- Comments  This patient was seen for a BPP/NST due to fetal growth  restriction.  She denies any problems since her last exam and  reports feeling vigorous fetal movements throughout the day.  A biophysical profile performed today due to fetal growth  restriction was 10 out of 10 with a reactive NST.  Doppler studies of the umbilical arteries showed a normal  S/D ratio of 2.76.  There were no signs of absent or reversed  end-diastolic flow.  Another BPP and umbilical artery Doppler study was  scheduled in 1 week.  Should IUGR continue to be noted later in her pregnancy,  delivery will be recommended at between 37 to 38 weeks. ----------------------------------------------------------------------                   Johnell Comings, MD Electronically Signed Final Report   10/08/2021 03:38 pm ----------------------------------------------------------------------    Assessment and Plan:  Pregnancy: G1P0 at [redacted]w[redacted]d 1. Poor fetal growth  affecting management of mother in third trimester, fetus 1 of multiple gestation Continue scans and antenatal testing as per MFM.  IOL to  be scheduled at 37-38 weeks.  2. Thyroid disease during pregnancy in third trimester Patient with subclinical hyperthyroidism, will check labs monthly as per MFM. - TSH - T4, free  3. [redacted] weeks gestation of pregnancy 4. Supervision of high-risk pregnancy, third trimester No other concerns. Pelvic cultures to be done next visit. Preterm labor symptoms and general obstetric precautions including but not limited to vaginal bleeding, contractions, leaking of fluid and fetal movement were reviewed in detail with the patient. Please refer to After Visit Summary for other counseling recommendations.   Return in about 2 weeks (around 10/23/2021) for OFFICE OB VISIT (MD only), Pelvic cultures.  Future Appointments  Date Time Provider [place]  10/15/2021  7:15 AM [facility] NURSE [facility]  10/15/2021  7:30 AM [facility] US2 [facility]  10/15/2021  8:45 AM [facility] NST [facility]  10/22/2021  1:30 PM [facility] NURSE [facility]  10/22/2021  1:45 PM [facility] US4 [facility]  10/22/2021  3:15 PM [facility] NST [facility]  10/29/2021  1:30 PM [facility] NURSE [facility]  10/29/2021  1:45 PM [facility] US5 [facility]  10/29/2021  3:15 PM [facility] NST [facility] [place] [facility]    Verita Schneiders, MD

## 2021-10-09 NOTE — Patient Instructions (Signed)
Return to office for any scheduled appointments. Call the office or go to the MAU at [facility] if:  You begin to have strong, frequent contractions  Your water breaks.  Sometimes it is a big gush of fluid, sometimes it is just a trickle that keeps getting your panties wet or running down your legs  You have vaginal bleeding.  It is normal to have a small amount of spotting if your cervix was checked.   You do not feel your baby moving like normal.  If you do not, get something to eat and drink and lay down and focus on feeling your baby move.   If your baby is still not moving like normal, you should call the office or go to MAU.  Any other obstetric concerns.   

## 2021-10-10 LAB — TSH: TSH: 0.046 u[IU]/mL — ABNORMAL LOW (ref 0.450–4.500)

## 2021-10-10 LAB — T4, FREE: Free T4: 0.97 ng/dL (ref 0.93–1.60)

## 2021-10-15 ENCOUNTER — Ambulatory Visit: Payer: Medicaid Other | Attending: Obstetrics

## 2021-10-15 ENCOUNTER — Ambulatory Visit: Payer: Medicaid Other

## 2021-10-22 ENCOUNTER — Other Ambulatory Visit: Payer: Self-pay | Admitting: Obstetrics

## 2021-10-22 ENCOUNTER — Ambulatory Visit: Payer: Medicaid Other | Attending: Obstetrics

## 2021-10-22 ENCOUNTER — Ambulatory Visit: Payer: Medicaid Other | Admitting: *Deleted

## 2021-10-22 ENCOUNTER — Encounter: Payer: Self-pay | Admitting: *Deleted

## 2021-10-22 ENCOUNTER — Ambulatory Visit: Payer: Medicaid Other

## 2021-10-22 ENCOUNTER — Other Ambulatory Visit: Payer: Self-pay

## 2021-10-22 VITALS — BP 131/76 | HR 105

## 2021-10-22 DIAGNOSIS — O219 Vomiting of pregnancy, unspecified: Secondary | ICD-10-CM | POA: Insufficient documentation

## 2021-10-22 DIAGNOSIS — R12 Heartburn: Secondary | ICD-10-CM | POA: Insufficient documentation

## 2021-10-22 DIAGNOSIS — R17 Unspecified jaundice: Secondary | ICD-10-CM | POA: Insufficient documentation

## 2021-10-22 DIAGNOSIS — O365930 Maternal care for other known or suspected poor fetal growth, third trimester, not applicable or unspecified: Secondary | ICD-10-CM

## 2021-10-22 DIAGNOSIS — O99333 Smoking (tobacco) complicating pregnancy, third trimester: Secondary | ICD-10-CM

## 2021-10-22 DIAGNOSIS — E079 Disorder of thyroid, unspecified: Secondary | ICD-10-CM | POA: Diagnosis not present

## 2021-10-22 DIAGNOSIS — Z3A36 36 weeks gestation of pregnancy: Secondary | ICD-10-CM

## 2021-10-22 DIAGNOSIS — O26893 Other specified pregnancy related conditions, third trimester: Secondary | ICD-10-CM

## 2021-10-22 DIAGNOSIS — O365931 Maternal care for other known or suspected poor fetal growth, third trimester, fetus 1: Secondary | ICD-10-CM | POA: Insufficient documentation

## 2021-10-22 DIAGNOSIS — O99283 Endocrine, nutritional and metabolic diseases complicating pregnancy, third trimester: Secondary | ICD-10-CM | POA: Diagnosis not present

## 2021-10-23 ENCOUNTER — Other Ambulatory Visit: Payer: Self-pay | Admitting: *Deleted

## 2021-10-23 DIAGNOSIS — O365930 Maternal care for other known or suspected poor fetal growth, third trimester, not applicable or unspecified: Secondary | ICD-10-CM

## 2021-10-24 ENCOUNTER — Other Ambulatory Visit (HOSPITAL_COMMUNITY)
Admission: RE | Admit: 2021-10-24 | Discharge: 2021-10-24 | Disposition: A | Discharge status: Home / Self Care | Payer: Medicaid Other | Source: Ambulatory Visit | Attending: Obstetrics and Gynecology | Admitting: Obstetrics and Gynecology

## 2021-10-24 ENCOUNTER — Ambulatory Visit (INDEPENDENT_AMBULATORY_CARE_PROVIDER_SITE_OTHER): Payer: Medicaid Other | Admitting: Obstetrics and Gynecology

## 2021-10-24 ENCOUNTER — Encounter: Payer: Self-pay | Admitting: Obstetrics and Gynecology

## 2021-10-24 VITALS — BP 114/75 | HR 102 | Wt 182.5 lb

## 2021-10-24 DIAGNOSIS — R7989 Other specified abnormal findings of blood chemistry: Secondary | ICD-10-CM

## 2021-10-24 DIAGNOSIS — O365931 Maternal care for other known or suspected poor fetal growth, third trimester, fetus 1: Secondary | ICD-10-CM

## 2021-10-24 DIAGNOSIS — O0993 Supervision of high risk pregnancy, unspecified, third trimester: Secondary | ICD-10-CM | POA: Insufficient documentation

## 2021-10-24 DIAGNOSIS — O99019 Anemia complicating pregnancy, unspecified trimester: Secondary | ICD-10-CM

## 2021-10-24 NOTE — Progress Notes (Signed)
   PRENATAL VISIT NOTE  Subjective:  Kari Neal is a 20 y.o. G1P0 at [redacted]w[redacted]d being seen today for ongoing prenatal care.  She is currently monitored for the following issues for this high-risk pregnancy and has Abdominal pain, recurrent; Serum total bilirubin elevated; Supervision of high-risk pregnancy, third trimester; Asthma; Nausea and vomiting in pregnancy; Anemia affecting pregnancy, antepartum; Low thyroid stimulating hormone (TSH) level; BMI 35.0-35.9,adult; IUGR (intrauterine growth restriction) affecting care of mother; and Heartburn in pregnancy in third trimester on their problem list.  Patient reports no complaints.  Contractions: Irregular. Vag. Bleeding: None.  Movement: Present. Denies leaking of fluid.   The following portions of the patient's history were reviewed and updated as appropriate: allergies, current medications, past family history, past medical history, past social history, past surgical history and problem list.   Objective:   Vitals:   10/24/21 1534  BP: 114/75  Pulse: (!) 102  Weight: 182 lb 8 oz (82.8 kg)    Fetal Status: Fetal Heart Rate (bpm): 135 Fundal Height: 34 cm Movement: Present     General:  Alert, oriented and cooperative. Patient is in no acute distress.  Skin: Skin is warm and dry. No rash noted.   Cardiovascular: Normal heart rate noted  Respiratory: Normal respiratory effort, no problems with respiration noted  Abdomen: Soft, gravid, appropriate for gestational age.  Pain/Pressure: Absent     Pelvic: Cervical exam performed in the presence of a chaperone Dilation: Closed Effacement (%): Thick Station: Ballotable  Extremities: Normal range of motion.  Edema: Trace  Mental Status: Normal mood and affect. Normal behavior. Normal judgment and thought content.   Assessment and Plan:  Pregnancy: G1P0 at [redacted]w[redacted]d 1. Supervision of high-risk pregnancy, third trimester Patient is doing well without complaints Cultures today  2. Poor fetal growth  affecting management of mother in third trimester, fetus 1 of multiple gestation Normal BPP, growth and dopplers 11/29 Continue weekly BPP until delivery  3. Low thyroid stimulating hormone (TSH) level Will check labs in 2 weeks  4. Anemia affecting pregnancy, antepartum Continue iron supplement  Preterm labor symptoms and general obstetric precautions including but not limited to vaginal bleeding, contractions, leaking of fluid and fetal movement were reviewed in detail with the patient. Please refer to After Visit Summary for other counseling recommendations.   Return in about 1 week (around 10/31/2021) for in person, ROB, High risk.  Future Appointments  Date Time Provider Department Center  10/29/2021  1:30 PM [facility]  10/29/2021  1:45 PM [facility] US5 [facility]  11/05/2021  1:00 PM [facility] NURSE [facility]  11/05/2021  1:15 PM [facility] US2 [facility]    Catalina Antigua, MD

## 2021-10-24 NOTE — Progress Notes (Signed)
ROB 36.4 wks GBS today No complaints

## 2021-10-25 LAB — CERVICOVAGINAL ANCILLARY ONLY
Chlamydia: NEGATIVE
Comment: NEGATIVE
Comment: NORMAL
Neisseria Gonorrhea: NEGATIVE

## 2021-10-28 LAB — CULTURE, BETA STREP (GROUP B ONLY): Strep Gp B Culture: NEGATIVE

## 2021-10-29 ENCOUNTER — Other Ambulatory Visit: Payer: Self-pay

## 2021-10-29 ENCOUNTER — Ambulatory Visit: Payer: Medicaid Other | Attending: Obstetrics

## 2021-10-29 ENCOUNTER — Ambulatory Visit: Payer: Medicaid Other

## 2021-10-29 ENCOUNTER — Ambulatory Visit: Payer: Medicaid Other | Admitting: *Deleted

## 2021-10-29 VITALS — BP 123/71 | HR 96

## 2021-10-29 DIAGNOSIS — J45909 Unspecified asthma, uncomplicated: Secondary | ICD-10-CM

## 2021-10-29 DIAGNOSIS — Z3A37 37 weeks gestation of pregnancy: Secondary | ICD-10-CM | POA: Diagnosis not present

## 2021-10-29 DIAGNOSIS — R17 Unspecified jaundice: Secondary | ICD-10-CM | POA: Diagnosis present

## 2021-10-29 DIAGNOSIS — O26893 Other specified pregnancy related conditions, third trimester: Secondary | ICD-10-CM

## 2021-10-29 DIAGNOSIS — R12 Heartburn: Secondary | ICD-10-CM | POA: Diagnosis present

## 2021-10-29 DIAGNOSIS — O365931 Maternal care for other known or suspected poor fetal growth, third trimester, fetus 1: Secondary | ICD-10-CM | POA: Insufficient documentation

## 2021-10-29 DIAGNOSIS — O219 Vomiting of pregnancy, unspecified: Secondary | ICD-10-CM | POA: Diagnosis present

## 2021-10-29 DIAGNOSIS — O99213 Obesity complicating pregnancy, third trimester: Secondary | ICD-10-CM | POA: Diagnosis not present

## 2021-10-29 DIAGNOSIS — E669 Obesity, unspecified: Secondary | ICD-10-CM

## 2021-10-29 DIAGNOSIS — O365930 Maternal care for other known or suspected poor fetal growth, third trimester, not applicable or unspecified: Secondary | ICD-10-CM | POA: Insufficient documentation

## 2021-10-31 ENCOUNTER — Ambulatory Visit (INDEPENDENT_AMBULATORY_CARE_PROVIDER_SITE_OTHER): Payer: Medicaid Other | Admitting: Obstetrics and Gynecology

## 2021-10-31 ENCOUNTER — Encounter: Payer: Self-pay | Admitting: Obstetrics and Gynecology

## 2021-10-31 ENCOUNTER — Other Ambulatory Visit: Payer: Self-pay | Admitting: Obstetrics

## 2021-10-31 ENCOUNTER — Other Ambulatory Visit: Payer: Self-pay

## 2021-10-31 VITALS — BP 117/76 | HR 93 | Wt 184.6 lb

## 2021-10-31 DIAGNOSIS — O0993 Supervision of high risk pregnancy, unspecified, third trimester: Secondary | ICD-10-CM

## 2021-10-31 DIAGNOSIS — O365931 Maternal care for other known or suspected poor fetal growth, third trimester, fetus 1: Secondary | ICD-10-CM

## 2021-10-31 DIAGNOSIS — O99019 Anemia complicating pregnancy, unspecified trimester: Secondary | ICD-10-CM

## 2021-10-31 DIAGNOSIS — R7989 Other specified abnormal findings of blood chemistry: Secondary | ICD-10-CM

## 2021-10-31 NOTE — Patient Instructions (Signed)
Vaginal Delivery ?Vaginal delivery means that you give birth by pushing your baby out of your birth canal (vagina). Your health care team will help you before, during, and after vaginal delivery. ?Birth experiences are unique for every woman and every pregnancy, and birth experiences vary depending on where you choose to give birth. ?What are the risks and benefits? ?Generally, this is safe. However, problems may occur, including: ?Bleeding. ?Infection. ?Damage to other structures such as vaginal tearing. ?Allergic reactions to medicines. ?Despite the risks, benefits of vaginal delivery include less risk of bleeding and infection and a shorter recovery time compared to a Cesarean delivery. Cesarean delivery, or C-section, is the surgical delivery of a baby. ?What happens when I arrive at the birth center or hospital? ?Once you are in labor and have been admitted into the hospital or birth center, your health care team may: ?Review your pregnancy history and any concerns that you have. ?Talk with you about your birth plan and discuss pain control options. ?Check your blood pressure, breathing, and heartbeat. ?Assess your baby's heartbeat. ?Monitor your uterus for contractions. ?Check whether your bag of water (amniotic sac) has broken (ruptured). ?Insert an IV into one of your veins. This may be used to give you fluids and medicines. ?Monitoring ?Your health care team may assess your contractions (uterine monitoring) and your baby's heart rate (fetal monitoring). You may need to be monitored: ?Often, but not continuously (intermittently). ?All the time or for long periods at a time (continuously). Continuous monitoring may be needed if: ?You are taking certain medicines, such as medicine to relieve pain or make your contractions stronger. ?You have pregnancy or labor complications. ?Monitoring may be done by: ?Placing a special stethoscope or a handheld monitoring device on your abdomen to check your baby's heartbeat  and to check for contractions. ?Placing monitors on your abdomen (external monitors) to record your baby's heartbeat and the frequency and length of contractions. ?Placing monitors inside your uterus through your vagina (internal monitors) to record your baby's heartbeat and the frequency, length, and strength of your contractions. Depending on the type of monitor, it may remain in your uterus or on your baby's head until birth. ?Telemetry. This is a type of continuous monitoring that can be done with external or internal monitors. Instead of having to stay in bed, you are able to move around. ?Physical exam ?Your health care team may perform frequent physical exams. This may include: ?Checking how and where your baby is positioned in your uterus. ?Checking your cervix to determine: ?Whether it is thinning out (effacing). ?Whether it is opening up (dilating). ?What happens during labor and delivery? ?Normal labor and delivery is divided into the following three stages: ?Stage 1 ?This is the longest stage of labor. ?Throughout this stage, you will feel contractions. Contractions generally feel mild, infrequent, and irregular at first. They get stronger, more frequent, and more regular as you move through this stage. You may have contractions about every 2-3 minutes. ?This stage ends when your cervix is completely dilated to 4 inches (10 cm) and completely effaced. ?Stage 2 ?This stage starts once your cervix is completely effaced and dilated and lasts until the delivery of your baby. ?This is the stage where you will feel an urge to push your baby out of your vagina. ?You may feel stretching and burning pain, especially when the widest part of your baby's head passes through the vaginal opening (crowning). ?Once your baby is delivered, the umbilical cord will be clamped and   cut. Timing of cutting the cord will depend on your wishes, your baby's health, and your health care provider's practices. ?Your baby will be  placed on your bare chest (skin-to-skin contact) in an upright position and covered with a warm blanket. If you are choosing to breastfeed, watch your baby for feeding cues, like rooting or sucking, and help the baby to your breast for his or her first feeding. ?Stage 3 ?This stage starts immediately after the birth of your baby and ends after you deliver the placenta. ?This stage may take anywhere from 5 to 30 minutes. ?After your baby has been delivered, you will feel contractions as your body expels the placenta. These contractions also help your uterus get smaller and reduce bleeding. ?What can I expect after labor and delivery? ?After labor is over, you and your baby will be assessed closely until you are ready to go home. Your health care team will teach you how to care for yourself and your baby. ?You and your baby may be encouraged to stay in the same room (rooming in) during your hospital stay. This will help promote early bonding and successful breastfeeding. ?Your uterus will be checked and massaged regularly (fundal massage). ?You may continue to receive fluids and medicines through an IV. ?You will have some soreness and pain in your abdomen, vagina, and the area of skin between your vaginal opening and your anus (perineum). ?If an incision was made near your vagina (episiotomy) or if you had some vaginal tearing during delivery, cold compresses may be placed on your episiotomy or your tear. This helps to reduce pain and swelling. ?It is normal to have vaginal bleeding after delivery. Wear a sanitary pad for vaginal bleeding and discharge. ?Summary ?Vaginal delivery means that you will give birth by pushing your baby out of your birth canal (vagina). ?Your health care team will monitor you and your baby throughout the stages of labor. ?After you deliver your baby, your health care team will continue to assess you and your baby to ensure you are both recovering as expected after delivery. ?This  information is not intended to replace advice given to you by your health care provider. Make sure you discuss any questions you have with your health care provider. ?Document Revised: 10/08/2020 Document Reviewed: 10/08/2020 ?Elsevier Patient Education ? 2022 Elsevier Inc. ? ?

## 2021-10-31 NOTE — Progress Notes (Signed)
Subjective:  Kari Neal is a 20 y.o. G1P0 at [redacted]w[redacted]d being seen today for ongoing prenatal care.  She is currently monitored for the following issues for this high-risk pregnancy and has Abdominal pain, recurrent; Serum total bilirubin elevated; Supervision of high-risk pregnancy, third trimester; Asthma; Nausea and vomiting in pregnancy; Anemia affecting pregnancy, antepartum; Low thyroid stimulating hormone (TSH) level; BMI 35.0-35.9,adult; IUGR (intrauterine growth restriction) affecting care of mother; and Heartburn in pregnancy in third trimester on their problem list.  Patient reports general discomforts of pregnancy.  Contractions: Irregular. Vag. Bleeding: None.  Movement: Present. Denies leaking of fluid.   The following portions of the patient's history were reviewed and updated as appropriate: allergies, current medications, past family history, past medical history, past social history, past surgical history and problem list. Problem list updated.  Objective:   Vitals:   10/31/21 0958  BP: 117/76  Pulse: 93  Weight: 184 lb 9.6 oz (83.7 kg)    Fetal Status: Fetal Heart Rate (bpm): 133   Movement: Present     General:  Alert, oriented and cooperative. Patient is in no acute distress.  Skin: Skin is warm and dry. No rash noted.   Cardiovascular: Normal heart rate noted  Respiratory: Normal respiratory effort, no problems with respiration noted  Abdomen: Soft, gravid, appropriate for gestational age. Pain/Pressure: Absent     Pelvic:  Cervical exam deferred        Extremities: Normal range of motion.  Edema: Trace  Mental Status: Normal mood and affect. Normal behavior. Normal judgment and thought content.   Urinalysis:      Assessment and Plan:  Pregnancy: G1P0 at [redacted]w[redacted]d  1. Supervision of high-risk pregnancy, third trimester Stable Labor precautions  2. Poor fetal growth affecting management of mother in third trimester, fetus 1 of multiple gestation Stable Growth 10 %  11/29 Continue with weekly antenatal testing with MFM IOL 39 weeks  3. Anemia affecting pregnancy, antepartum Continue with iron supplement  4. Low thyroid stimulating hormone (TSH) level  - Thyroid Panel With TSH  Term labor symptoms and general obstetric precautions including but not limited to vaginal bleeding, contractions, leaking of fluid and fetal movement were reviewed in detail with the patient. Please refer to After Visit Summary for other counseling recommendations.  Return in about 1 week (around 11/07/2021) for OB visit, face to face, MD only.   Hermina Staggers, MD

## 2021-10-31 NOTE — Progress Notes (Signed)
Pt presents for ROB  T4, free 0.97 on 10/09/21 TSH 0.046 on 10/09/21 BPP 8/8 on 10/29/21, next BPP scheduled 11/05/21 PHQ9= 3 GAD7= 3

## 2021-11-01 ENCOUNTER — Other Ambulatory Visit (HOSPITAL_COMMUNITY): Payer: Self-pay | Admitting: Advanced Practice Midwife

## 2021-11-05 ENCOUNTER — Ambulatory Visit: Payer: Medicaid Other | Admitting: *Deleted

## 2021-11-05 ENCOUNTER — Telehealth: Payer: Self-pay | Admitting: Obstetrics & Gynecology

## 2021-11-05 ENCOUNTER — Ambulatory Visit: Payer: Medicaid Other | Attending: Obstetrics

## 2021-11-05 ENCOUNTER — Encounter: Payer: Self-pay | Admitting: *Deleted

## 2021-11-05 ENCOUNTER — Other Ambulatory Visit: Payer: Self-pay

## 2021-11-05 VITALS — BP 135/77 | HR 109

## 2021-11-05 DIAGNOSIS — O99283 Endocrine, nutritional and metabolic diseases complicating pregnancy, third trimester: Secondary | ICD-10-CM | POA: Diagnosis not present

## 2021-11-05 DIAGNOSIS — E669 Obesity, unspecified: Secondary | ICD-10-CM

## 2021-11-05 DIAGNOSIS — R12 Heartburn: Secondary | ICD-10-CM | POA: Diagnosis present

## 2021-11-05 DIAGNOSIS — O219 Vomiting of pregnancy, unspecified: Secondary | ICD-10-CM

## 2021-11-05 DIAGNOSIS — O99213 Obesity complicating pregnancy, third trimester: Secondary | ICD-10-CM

## 2021-11-05 DIAGNOSIS — R17 Unspecified jaundice: Secondary | ICD-10-CM | POA: Insufficient documentation

## 2021-11-05 DIAGNOSIS — Z3A38 38 weeks gestation of pregnancy: Secondary | ICD-10-CM

## 2021-11-05 DIAGNOSIS — O365930 Maternal care for other known or suspected poor fetal growth, third trimester, not applicable or unspecified: Secondary | ICD-10-CM | POA: Insufficient documentation

## 2021-11-05 DIAGNOSIS — O99333 Smoking (tobacco) complicating pregnancy, third trimester: Secondary | ICD-10-CM | POA: Diagnosis not present

## 2021-11-05 DIAGNOSIS — E079 Disorder of thyroid, unspecified: Secondary | ICD-10-CM

## 2021-11-05 DIAGNOSIS — O26893 Other specified pregnancy related conditions, third trimester: Secondary | ICD-10-CM | POA: Diagnosis present

## 2021-11-05 NOTE — Telephone Encounter (Signed)
Patient called today wanting to cancel her upcoming induction.  Patient also states she does not know why she is being induced.   Routing to [facility] and MFM for follow-up instructions for patient.   I will call patient back with updates.

## 2021-11-10 ENCOUNTER — Other Ambulatory Visit: Payer: Self-pay

## 2021-11-10 ENCOUNTER — Encounter (HOSPITAL_COMMUNITY): Payer: Self-pay | Admitting: Obstetrics and Gynecology

## 2021-11-10 ENCOUNTER — Inpatient Hospital Stay (HOSPITAL_COMMUNITY)
Admission: AD | Admit: 2021-11-10 | Discharge: 2021-11-14 | DRG: 807 | Disposition: A | Discharge status: Home / Self Care | Payer: Medicaid Other | Attending: Obstetrics & Gynecology | Admitting: Obstetrics & Gynecology

## 2021-11-10 ENCOUNTER — Inpatient Hospital Stay (HOSPITAL_COMMUNITY): Payer: Medicaid Other

## 2021-11-10 DIAGNOSIS — Z87891 Personal history of nicotine dependence: Secondary | ICD-10-CM

## 2021-11-10 DIAGNOSIS — O365930 Maternal care for other known or suspected poor fetal growth, third trimester, not applicable or unspecified: Secondary | ICD-10-CM | POA: Diagnosis not present

## 2021-11-10 DIAGNOSIS — O4202 Full-term premature rupture of membranes, onset of labor within 24 hours of rupture: Secondary | ICD-10-CM | POA: Diagnosis not present

## 2021-11-10 DIAGNOSIS — O9952 Diseases of the respiratory system complicating childbirth: Secondary | ICD-10-CM | POA: Diagnosis present

## 2021-11-10 DIAGNOSIS — Z20822 Contact with and (suspected) exposure to covid-19: Secondary | ICD-10-CM | POA: Diagnosis present

## 2021-11-10 DIAGNOSIS — Z9049 Acquired absence of other specified parts of digestive tract: Secondary | ICD-10-CM | POA: Diagnosis not present

## 2021-11-10 DIAGNOSIS — J45909 Unspecified asthma, uncomplicated: Secondary | ICD-10-CM | POA: Diagnosis present

## 2021-11-10 DIAGNOSIS — R7989 Other specified abnormal findings of blood chemistry: Secondary | ICD-10-CM | POA: Diagnosis present

## 2021-11-10 DIAGNOSIS — Z3A39 39 weeks gestation of pregnancy: Secondary | ICD-10-CM

## 2021-11-10 DIAGNOSIS — O0993 Supervision of high risk pregnancy, unspecified, third trimester: Secondary | ICD-10-CM

## 2021-11-10 DIAGNOSIS — O365990 Maternal care for other known or suspected poor fetal growth, unspecified trimester, not applicable or unspecified: Secondary | ICD-10-CM | POA: Diagnosis present

## 2021-11-10 LAB — CBC
HCT: 27.6 % — ABNORMAL LOW (ref 36.0–46.0)
Hemoglobin: 10.2 g/dL — ABNORMAL LOW (ref 12.0–15.0)
MCH: 32.9 pg (ref 26.0–34.0)
MCHC: 37 g/dL — ABNORMAL HIGH (ref 30.0–36.0)
MCV: 89 fL (ref 80.0–100.0)
Platelets: 309 10*3/uL (ref 150–400)
RBC: 3.1 MIL/uL — ABNORMAL LOW (ref 3.87–5.11)
RDW: 18.6 % — ABNORMAL HIGH (ref 11.5–15.5)
WBC: 13.1 10*3/uL — ABNORMAL HIGH (ref 4.0–10.5)
nRBC: 0.2 % (ref 0.0–0.2)

## 2021-11-10 LAB — RESP PANEL BY RT-PCR (FLU A&B, COVID) ARPGX2
Influenza A by PCR: NEGATIVE
Influenza B by PCR: NEGATIVE
SARS Coronavirus 2 by RT PCR: NEGATIVE

## 2021-11-10 LAB — TYPE AND SCREEN
ABO/RH(D): O POS
Antibody Screen: NEGATIVE

## 2021-11-10 MED ORDER — ONDANSETRON HCL 4 MG/2ML IJ SOLN: 4.0000 mg | Freq: Four times a day (QID) | INTRAMUSCULAR | Status: DC | PRN

## 2021-11-10 MED ORDER — OXYTOCIN BOLUS FROM INFUSION
333.0000 mL | Freq: Once | INTRAVENOUS | Status: AC
  Administered 2021-11-12: 02:00:00 333 mL via INTRAVENOUS

## 2021-11-10 MED ORDER — OXYCODONE-ACETAMINOPHEN 5-325 MG PO TABS: 1.0000 | ORAL_TABLET | ORAL | Status: DC | PRN

## 2021-11-10 MED ORDER — OXYTOCIN-SODIUM CHLORIDE 30-0.9 UT/500ML-% IV SOLN: 2.5000 [IU]/h | INTRAVENOUS | Status: DC

## 2021-11-10 MED ORDER — SOD CITRATE-CITRIC ACID 500-334 MG/5ML PO SOLN: 30.0000 mL | ORAL | Status: DC | PRN

## 2021-11-10 MED ORDER — ACETAMINOPHEN 325 MG PO TABS: 650.0000 mg | ORAL_TABLET | ORAL | Status: DC | PRN

## 2021-11-10 MED ORDER — OXYCODONE-ACETAMINOPHEN 5-325 MG PO TABS: 2.0000 | ORAL_TABLET | ORAL | Status: DC | PRN

## 2021-11-10 MED ORDER — FENTANYL CITRATE (PF) 100 MCG/2ML IJ SOLN
50.0000 ug | INTRAMUSCULAR | Status: DC | PRN
  Administered 2021-11-11: 07:00:00 100 ug via INTRAVENOUS
  Filled 2021-11-10: qty 2

## 2021-11-10 MED ORDER — LACTATED RINGERS IV SOLN: 500.0000 mL | INTRAVENOUS | Status: DC | PRN

## 2021-11-10 MED ORDER — LIDOCAINE HCL (PF) 1 % IJ SOLN
30.0000 mL | INTRAMUSCULAR | Status: AC | PRN
  Administered 2021-11-12: 02:00:00 30 mL via SUBCUTANEOUS
  Filled 2021-11-10: qty 30

## 2021-11-10 MED ORDER — LACTATED RINGERS IV SOLN: INTRAVENOUS | Status: DC

## 2021-11-11 ENCOUNTER — Inpatient Hospital Stay (HOSPITAL_COMMUNITY): Payer: Medicaid Other | Admitting: Anesthesiology

## 2021-11-11 ENCOUNTER — Encounter: Payer: Medicaid Other | Admitting: Obstetrics and Gynecology

## 2021-11-11 ENCOUNTER — Encounter (HOSPITAL_COMMUNITY): Payer: Self-pay | Admitting: Obstetrics and Gynecology

## 2021-11-11 DIAGNOSIS — Z9049 Acquired absence of other specified parts of digestive tract: Secondary | ICD-10-CM

## 2021-11-11 LAB — RPR: RPR Ser Ql: NONREACTIVE

## 2021-11-11 MED ORDER — PHENYLEPHRINE 40 MCG/ML (10ML) SYRINGE FOR IV PUSH (FOR BLOOD PRESSURE SUPPORT)
80.0000 ug | PREFILLED_SYRINGE | INTRAVENOUS | Status: DC | PRN
  Filled 2021-11-11: qty 10

## 2021-11-11 MED ORDER — DIPHENHYDRAMINE HCL 50 MG/ML IJ SOLN: 12.5000 mg | INTRAMUSCULAR | Status: DC | PRN

## 2021-11-11 MED ORDER — OXYTOCIN-SODIUM CHLORIDE 30-0.9 UT/500ML-% IV SOLN: 1.0000 m[IU]/min | INTRAVENOUS | Status: DC

## 2021-11-11 MED ORDER — EPHEDRINE 5 MG/ML INJ: 10.0000 mg | INTRAVENOUS | Status: DC | PRN

## 2021-11-11 MED ORDER — TERBUTALINE SULFATE 1 MG/ML IJ SOLN
0.2500 mg | Freq: Once | INTRAMUSCULAR | Status: DC | PRN
  Filled 2021-11-11: qty 1

## 2021-11-11 MED ORDER — FENTANYL-BUPIVACAINE-NACL 0.5-0.125-0.9 MG/250ML-% EP SOLN
EPIDURAL | Status: DC | PRN
  Administered 2021-11-11: 12 mL/h via EPIDURAL

## 2021-11-11 MED ORDER — OXYTOCIN-SODIUM CHLORIDE 30-0.9 UT/500ML-% IV SOLN
1.0000 m[IU]/min | INTRAVENOUS | Status: DC
  Administered 2021-11-11 (×2): 2 m[IU]/min via INTRAVENOUS
  Filled 2021-11-11: qty 500

## 2021-11-11 MED ORDER — PHENYLEPHRINE 40 MCG/ML (10ML) SYRINGE FOR IV PUSH (FOR BLOOD PRESSURE SUPPORT): 80.0000 ug | PREFILLED_SYRINGE | INTRAVENOUS | Status: DC | PRN

## 2021-11-11 MED ORDER — LACTATED RINGERS IV SOLN: 500.0000 mL | Freq: Once | INTRAVENOUS | Status: DC

## 2021-11-11 MED ORDER — LIDOCAINE-EPINEPHRINE (PF) 2 %-1:200000 IJ SOLN
INTRAMUSCULAR | Status: DC | PRN
  Administered 2021-11-11: 5 mL via EPIDURAL

## 2021-11-11 MED ORDER — FENTANYL-BUPIVACAINE-NACL 0.5-0.125-0.9 MG/250ML-% EP SOLN
12.0000 mL/h | EPIDURAL | Status: DC | PRN
  Administered 2021-11-11: 12 mL/h via EPIDURAL
  Filled 2021-11-11 (×2): qty 250

## 2021-11-11 MED ORDER — MISOPROSTOL 25 MCG QUARTER TABLET
25.0000 ug | ORAL_TABLET | ORAL | Status: DC | PRN
  Administered 2021-11-11 (×2): 25 ug via VAGINAL
  Filled 2021-11-11 (×2): qty 1

## 2021-11-11 NOTE — Anesthesia Preprocedure Evaluation (Signed)
Anesthesia Evaluation  Patient identified by MRN, date of birth, ID band Patient awake    Reviewed: Allergy & Precautions, NPO status , Patient's Chart, lab work & pertinent test results  Airway Mallampati: III  TM Distance: >3 FB Neck ROM: Full    Dental no notable dental hx.    Pulmonary asthma , former smoker,    Pulmonary exam normal breath sounds clear to auscultation       Cardiovascular negative cardio ROS Normal cardiovascular exam Rhythm:Regular Rate:Normal     Neuro/Psych negative neurological ROS  negative psych ROS   GI/Hepatic negative GI ROS, Neg liver ROS,   Endo/Other  negative endocrine ROS  Renal/GU negative Renal ROS  negative genitourinary   Musculoskeletal negative musculoskeletal ROS (+)   Abdominal (+) + obese,   Peds  Hematology negative hematology ROS (+)   Anesthesia Other Findings IOL for IUGR  Reproductive/Obstetrics (+) Pregnancy                             Anesthesia Physical Anesthesia Plan  ASA: 2  Anesthesia Plan: Epidural   Post-op Pain Management:    Induction:   PONV Risk Score and Plan: Treatment may vary due to age or medical condition  Airway Management Planned: Natural Airway  Additional Equipment:   Intra-op Plan:   Post-operative Plan:   Informed Consent: I have reviewed the patients History and Physical, chart, labs and discussed the procedure including the risks, benefits and alternatives for the proposed anesthesia with the patient or authorized representative who has indicated his/her understanding and acceptance.       Plan Discussed with: Anesthesiologist  Anesthesia Plan Comments: (Patient identified. Risks, benefits, options discussed with patient including but not limited to bleeding, infection, nerve damage, paralysis, failed block, incomplete pain control, headache, blood pressure changes, nausea, vomiting, reactions  to medication, itching, and post partum back pain. Confirmed with bedside nurse the patient's most recent platelet count. Confirmed with the patient that they are not taking any anticoagulation, have any bleeding history or any family history of bleeding disorders. Patient expressed understanding and wishes to proceed. All questions were answered. )        Anesthesia Quick Evaluation

## 2021-11-11 NOTE — Progress Notes (Signed)
Kari Neal is a 20 y.o. G1P0 at [redacted]w[redacted]d here for IOL for FGR  Reviewed tracing with RN. Cat I at this time.  FHT:  FHR: 135 bpm, variability: moderate,  accelerations:  Present,  decelerations:  Absent UC:   regular, every 10 minutes SVE:   Dilation: Lip/rim Effacement (%): 100 Station: 0, Plus 1 Exam by:: Melton Alar B, RN Contractions every 10-15 minutes  Will restart pitocin at this time. Start at 1 and up by 1    Warner Mccreedy, MD, MPH OB Fellow, Faculty Practice

## 2021-11-11 NOTE — Progress Notes (Signed)
Kari Neal is a 20 y.o. G1P0 at [redacted]w[redacted]d by LMP admitted for induction of labor due to Poor fetal growth.  Subjective:   Objective: BP 124/63    Pulse 71    Temp 97.9 F (36.6 C) (Oral)    Resp 16    Ht 5\' 1"  (1.549 m)    Wt 85.2 kg    LMP 02/10/2021    BMI 35.48 kg/m  No intake/output data recorded. No intake/output data recorded.  FHT:  FHR: 125 bpm, variability: moderate,  accelerations:  Present,  decelerations:  Absent UC:   regular, every 2-4 minutes SVE:   Dilation: 2.5 Effacement (%): 50 Station: -3 Exam by:: [email or web address], RN  Labs: Lab Results  Component Value Date   WBC 13.1 (H) 11/10/2021   HGB 10.2 (L) 11/10/2021   HCT 27.6 (L) 11/10/2021   MCV 89.0 11/10/2021   PLT 309 11/10/2021    Assessment / Plan: Induction of labor due to IUGR,  progressing well on pitocin  Labor: Progressing normally, will start Pitocin at 1030 Preeclampsia:   n/a Fetal Wellbeing:  Category I Pain Control:  Epidural I/D:   GBS Neg Anticipated MOD:  NSVD  11/12/2021, CNM 11/11/2021, 9:58 AM

## 2021-11-11 NOTE — H&P (Addendum)
Lynora Dymond is a 20 y.o. female G1P0 with IUP at 78w1dby LMP c/w [place] . 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  She reports positive fetal movement. She denies leakage of fluid or vaginal bleeding.  [facility]  [facility] Past Medical History: Past Medical History:  Diagnosis Date   Abdominal pain, recurrent    Knee crepitus     Past Surgical History: Past Surgical History:  Procedure Laterality Date   CHOLECYSTECTOMY N/A 01/13/2019   Procedure: LAPAROSCOPIC CHOLECYSTECTOMY WITH INTRAOPERATIVE CHOLANGIOGRAM ERAS PATHWAY;  Surgeon: NAlphonsa Overall MD;  [facility]  Service: General;  Laterality: N/A;    Obstetrical History: OB History     Gravida  1   Para      Term      Preterm      AB      Living         SAB      IAB      Ectopic      Multiple      Live Births               Social History: Social History   Socioeconomic History   Marital status: Single    Spouse name: Not on file   Number of children: Not on file   Years of education: Not on file   Highest education level: Not on file  Occupational History   Not on file  Tobacco Use   Smoking status: Former    Types: E-cigarettes    Start date: 04/26/2019    Passive exposure: Current   Smokeless tobacco: Never  Vaping Use   Vaping Use: Never used  Substance and Sexual Activity   Alcohol use: Not Currently   Drug use: No   Sexual activity: Not Currently  Other Topics Concern   Not on file  Social History Narrative   Not on file   Social Determinants of Health   Financial Resource Strain: Not on file  Food Insecurity: Not on file  Transportation Needs: Not on file  Physical Activity: Not on file  Stress: Not on file  Social Connections: Not on file    Family History: Family History  Problem Relation Age of Onset   Anxiety disorder Mother    Miscarriages /  [place]    Diabetes Paternal Aunt    Cancer Maternal Grandmother    Breast cancer Maternal Grandmother    Hypertension Maternal Grandmother    Miscarriages / Stillbirths Maternal Grandmother    Diabetes Maternal Grandfather    Arthritis Paternal Grandmother     Allergies: Allergies  Allergen Reactions   Shellfish Allergy Hives    Medications Prior to Admission  Medication Sig Dispense Refill Last Dose   albuterol (VENTOLIN HFA) 108 (90 Base) MCG/ACT inhaler Inhale 1-2 puffs into the lungs every 6 (six) hours as needed for wheezing or shortness of breath. 1 each 0 Past Month   aspirin EC 81 MG tablet Take 1 tablet (81 mg total) by mouth daily. Swallow whole. 90 tablet 11 Past Week   ferrous sulfate 325 (65 FE) MG tablet Take 1 tablet (325 mg total) by mouth every other day. 30 tablet 5 Past Week   Prenatal Vit-Fe Fumarate-FA (PREPLUS) 27-1 MG TABS Take 1 tablet by mouth daily. 30 tablet 13 Past Week   Blood Pressure Monitoring (BLOOD PRESSURE KIT) DEVI 1 kit by Does not apply route once  a week. Check Blood Pressure regularly and record readings into the Bab[employer] App.  Large Cuff.  DX O90.0 (Patient taking differently: 1 kit by Does not apply route once a week. Check Blood Pressure regularly and record readings into the Babyscript[employer] App.  Large Cuff.  DX O90.0) 1 each 0     Review of Systems   Constitutional: Negative for fever and chills Eyes: Negative for visual disturbances Respiratory: Negative for shortness of breath, dyspnea Cardiovascular: Negative for chest pain or palpitations  Gastrointestinal: Negative for vomiting, diarrhea and constipation.  POSITIVE for abdominal pain (contractions) Genitourinary: Negative for dysuria and urgency Musculoskeletal: Negative for back pain, joint pain, myalgias  Neurological: Negative for dizziness and headaches  Blood pressure 134/76, pulse (!) 102, temperature 98.1 F (36.7 C), temperature source Oral, resp. rate 18, height  '5\' 1"'  (1.549 m), weight 85.2 kg, last menstrual period 02/10/2021. General appearance: alert, cooperative, and mild distress Lungs: normal respiratory effort Heart: regular rate and rhythm Abdomen: soft, non-tender; bowel sounds normal Extremities: Homans sign is negative, no sign of DVT DTR's 2+ Presentation: cephalic Fetal monitoring  Baseline: 120 bpm; mod var, present acel, no decels Uterine activity  None Dilation: 1.5 Effacement (%): Thick Station: -3 Exam by:: Benay Pillow, RN   Prenatal labs: ABO, Rh: --/--/O POS (12/18 2130) Antibody: NEG (12/18 2130) Rubella: 11.00 (06/14 1334) RPR: Non Reactive (10/07 0838)  HBsAg: Negative (06/14 1334)  HIV: Non Reactive (10/07 7510)  GBS: Negative/-- (12/01 1558)  1 hr Glucola passed Genetic screening   Anatomy US normal but with FGR  Prenatal Transfer Tool  Maternal Diabetes: No Genetic Screening: Normal Maternal Ultrasounds/Referrals: IUGR Fetal Ultrasounds or other Referrals:  Referred to Materal Fetal Medicine  Maternal Substance Abuse:  No Significant Maternal Medications:  None Significant Maternal Lab Results: Group B Strep negative  Results for orders placed or performed during the hospital encounter of 11/10/21 (from the past 24 hour(s))  CBC   Collection Time: 11/10/21  9:30 PM  Result Value Ref Range   WBC 13.1 (H) 4.0 - 10.5 K/uL   RBC 3.10 (L) 3.87 - 5.11 MIL/uL   Hemoglobin 10.2 (L) 12.0 - 15.0 g/dL   HCT 27.6 (L) 36.0 - 46.0 %   MCV 89.0 80.0 - 100.0 fL   MCH 32.9 26.0 - 34.0 pg   MCHC 37.0 (H) 30.0 - 36.0 g/dL   RDW 18.6 (H) 11.5 - 15.5 %   Platelets 309 150 - 400 K/uL   nRBC 0.2 0.0 - 0.2 %  Type and screen   Collection Time: 11/10/21  9:30 PM  Result Value Ref Range   ABO/RH(D) O POS    Antibody Screen NEG    Sample Expiration      11/13/2021,2359 Performed at [facility], [address] [place], [place] [address]   Resp Panel by RT-PCR (Flu A&B, Covid) Nasopharyngeal Swab    Collection Time: 11/10/21  9:34 PM   Specimen: Nasopharyngeal Swab; Nasopharyngeal(NP) swabs in vial transport medium  Result Value Ref Range   SARS Coronavirus 2 by RT PCR NEGATIVE NEGATIVE   Influenza A by PCR NEGATIVE NEGATIVE   Influenza B by PCR NEGATIVE NEGATIVE    Assessment: Sameeha Rockefeller is a 20 y.o. G1P0 with an IUP at 70w1dpresenting for IOL for FGR (10-% on 12-1). EFW was 5lbs 5 oz; 2401  gm        Plan: #Labor: will start with vaginal cytotec, patient does not want FB at this time.  #  Pain:  Per request #FWB Cat 1 #ID: GBS: neg #MOF:  breast #MOC: patient would like IUD at [facility] visit #Circ: Yes    Mervyn Skeeters Aristeo Hankerson 11/11/2021, 1:25 AM

## 2021-11-11 NOTE — Anesthesia Procedure Notes (Signed)
Epidural Patient location during procedure: OB Start time: 11/11/2021 8:00 AM End time: 11/11/2021 8:15 AM  Staffing Anesthesiologist: Elmer Picker, MD Performed: anesthesiologist   Preanesthetic Checklist Completed: patient identified, IV checked, risks and benefits discussed, monitors and equipment checked, pre-op evaluation and timeout performed  Epidural Patient position: sitting Prep: DuraPrep and site prepped and draped Patient monitoring: continuous pulse ox, blood pressure, heart rate and cardiac monitor Approach: midline Location: L3-L4 Injection technique: LOR air  Needle:  Needle type: Tuohy  Needle gauge: 17 G Needle length: 9 cm Needle insertion depth: 6 cm Catheter type: closed end flexible Catheter size: 19 Gauge Catheter at skin depth: 11 cm Test dose: negative  Assessment Sensory level: T8 Events: blood not aspirated, injection not painful, no injection resistance, no paresthesia and negative IV test  Additional Notes Patient identified. Risks/Benefits/Options discussed with patient including but not limited to bleeding, infection, nerve damage, paralysis, failed block, incomplete pain control, headache, blood pressure changes, nausea, vomiting, reactions to medication both or allergic, itching and postpartum back pain. Confirmed with bedside nurse the patient's most recent platelet count. Confirmed with patient that they are not currently taking any anticoagulation, have any bleeding history or any family history of bleeding disorders. Patient expressed understanding and wished to proceed. All questions were answered. Sterile technique was used throughout the entire procedure. Please see nursing notes for vital signs. Test dose was given through epidural catheter and negative prior to continuing to dose epidural or start infusion. Warning signs of high block given to the patient including shortness of breath, tingling/numbness in hands, complete motor block,  or any concerning symptoms with instructions to call for help. Patient was given instructions on fall risk and not to get out of bed. All questions and concerns addressed with instructions to call with any issues or inadequate analgesia.  Reason for block:procedure for pain

## 2021-11-11 NOTE — Progress Notes (Signed)
Kari Neal is a 20 y.o. G1P0 at [redacted]w[redacted]d admitted for induction of labor due to FGR (10%ile).  Subjective: Patient reports she feels pressure. Not constant.  Objective: BP 107/67    Pulse 82    Temp 98.5 F (36.9 C) (Oral)    Resp 18    Ht 5\' 1"  (1.549 m)    Wt 85.2 kg    LMP 02/10/2021    BMI 35.48 kg/m  No intake/output data recorded. No intake/output data recorded.  FHT:  FHR: 140 bpm, variability: moderate,  accelerations:  Abscent,  decelerations: 6 min prolonged (SEE details below in plan) resolved with pit off and repositioning UC:   regular, every 3-4 minutes SVE:   Dilation: Lip/rim Effacement (%): 100 Station: -1, 0 Exam by:: Dr [email or web address]  Labs: Lab Results  Component Value Date   WBC 13.1 (H) 11/10/2021   HGB 10.2 (L) 11/10/2021   HCT 27.6 (L) 11/10/2021   MCV 89.0 11/10/2021   PLT 309 11/10/2021    Assessment / Plan: Induction of labor due to FGR. During the day today her cervix has changed from 4 cm to 6 cm and was at 6 cm for prolonged period of time. Patient SROMed at 0430 on 12/19.  Pitocin was started at 1045AM but was stopped due to decelerations. Pit was restarted at 1840 and was left on for ~2 hrs and got to 4/hr. At time of current evaluation having a prolonged deceleration - cervix checked and 9.5 cm, 80% effaced. Station -1 to 0.  - IUPC placed - patient's position changed to hands and knees - pit turned off - plan for FSE placement at next check once patient is repositioned from hands and knees.  - patient contracting on own, keep pitocin off - will reassess in 2-3 hours or sooner if clinically warranted  Fetal Wellbeing:  Category II for prolonged decel. Now resolved. Now cat I while patient on hands and knees with moderate variability.  Pain Control:  Epidural I/D:   GBS neg  Dr. 02-08-1984 aware of plan and agrees.  Charlotta Newton 11/11/2021, 9:16 PM

## 2021-11-12 ENCOUNTER — Encounter (HOSPITAL_COMMUNITY): Payer: Self-pay | Admitting: Obstetrics and Gynecology

## 2021-11-12 DIAGNOSIS — O4202 Full-term premature rupture of membranes, onset of labor within 24 hours of rupture: Secondary | ICD-10-CM

## 2021-11-12 DIAGNOSIS — O365930 Maternal care for other known or suspected poor fetal growth, third trimester, not applicable or unspecified: Secondary | ICD-10-CM

## 2021-11-12 DIAGNOSIS — Z3A39 39 weeks gestation of pregnancy: Secondary | ICD-10-CM

## 2021-11-12 MED ORDER — SIMETHICONE 80 MG PO CHEW: 80.0000 mg | CHEWABLE_TABLET | ORAL | Status: DC | PRN

## 2021-11-12 MED ORDER — BENZOCAINE-MENTHOL 20-0.5 % EX AERO
1.0000 "application " | INHALATION_SPRAY | CUTANEOUS | Status: DC | PRN
  Administered 2021-11-12: 1 via TOPICAL
  Filled 2021-11-12: qty 56

## 2021-11-12 MED ORDER — COCONUT OIL OIL: 1.0000 "application " | TOPICAL_OIL | Status: DC | PRN

## 2021-11-12 MED ORDER — TETANUS-DIPHTH-ACELL PERTUSSIS 5-2.5-18.5 LF-MCG/0.5 IM SUSY: 0.5000 mL | PREFILLED_SYRINGE | Freq: Once | INTRAMUSCULAR | Status: DC

## 2021-11-12 MED ORDER — WITCH HAZEL-GLYCERIN EX PADS: 1.0000 "application " | MEDICATED_PAD | CUTANEOUS | Status: DC | PRN

## 2021-11-12 MED ORDER — DIPHENHYDRAMINE HCL 25 MG PO CAPS: 25.0000 mg | ORAL_CAPSULE | Freq: Four times a day (QID) | ORAL | Status: DC | PRN

## 2021-11-12 MED ORDER — SENNOSIDES-DOCUSATE SODIUM 8.6-50 MG PO TABS
2.0000 | ORAL_TABLET | Freq: Every day | ORAL | Status: DC
  Administered 2021-11-13 – 2021-11-14 (×2): 2 via ORAL
  Filled 2021-11-12 (×2): qty 2

## 2021-11-12 MED ORDER — ONDANSETRON HCL 4 MG/2ML IJ SOLN: 4.0000 mg | INTRAMUSCULAR | Status: DC | PRN

## 2021-11-12 MED ORDER — IBUPROFEN 600 MG PO TABS
600.0000 mg | ORAL_TABLET | Freq: Four times a day (QID) | ORAL | Status: DC
  Administered 2021-11-12 – 2021-11-14 (×10): 600 mg via ORAL
  Filled 2021-11-12 (×10): qty 1

## 2021-11-12 MED ORDER — DIBUCAINE (PERIANAL) 1 % EX OINT: 1.0000 "application " | TOPICAL_OINTMENT | CUTANEOUS | Status: DC | PRN

## 2021-11-12 MED ORDER — ONDANSETRON HCL 4 MG PO TABS: 4.0000 mg | ORAL_TABLET | ORAL | Status: DC | PRN

## 2021-11-12 MED ORDER — ACETAMINOPHEN 325 MG PO TABS
650.0000 mg | ORAL_TABLET | ORAL | Status: DC | PRN
  Administered 2021-11-12 – 2021-11-13 (×2): 650 mg via ORAL
  Filled 2021-11-12 (×2): qty 2

## 2021-11-12 MED ORDER — PRENATAL MULTIVITAMIN CH
1.0000 | ORAL_TABLET | Freq: Every day | ORAL | Status: DC
  Administered 2021-11-12 – 2021-11-14 (×3): 1 via ORAL
  Filled 2021-11-12 (×3): qty 1

## 2021-11-12 MED ORDER — MEASLES, MUMPS & RUBELLA VAC IJ SOLR: 0.5000 mL | Freq: Once | INTRAMUSCULAR | Status: DC

## 2021-11-12 MED ORDER — MEDROXYPROGESTERONE ACETATE 150 MG/ML IM SUSP: 150.0000 mg | INTRAMUSCULAR | Status: DC | PRN

## 2021-11-12 NOTE — Discharge Summary (Signed)
Postpartum Discharge Summary   Patient Name: Kari Neal DOB: 01-29-2001 MRN: [id number]  Date of admission: 11/10/2021 Delivery date:11/12/2021  Delivering provider: Renard Matter  Date of discharge: 11/14/2021  Admitting diagnosis: IUGR (intrauterine growth restriction) affecting care of mother [O36.5990] Intrauterine pregnancy: [redacted]w[redacted]d    Secondary diagnosis:  Principal Problem:   IUGR (intrauterine growth restriction) affecting care of mother Active Problems:   Supervision of high-risk pregnancy, third trimester   Asthma   Low thyroid stimulating hormone (TSH) level   History of cholecystectomy  Additional problems: None    Discharge diagnosis: Term Pregnancy Delivered                                              Post partum procedures: None Augmentation: AROM, Pitocin, and Cytotec Complications: None  Hospital course: Induction of Labor With Vaginal Delivery   20y.o. yo G1P0 at 352w2das admitted to the hospital 11/10/2021 for induction of labor.  Indication for induction:  FGR .  Patient had an uncomplicated labor course as follows: Membrane Rupture Time/Date: 4:30 AM ,11/11/2021   Delivery Method:Vaginal, Spontaneous  Episiotomy:   Lacerations:  2nd degree;Periurethral  Details of delivery can be found in separate delivery note.  Patient had a routine postpartum course. Patient is discharged home 11/14/21.  Newborn Data: Birth date:11/12/2021  Birth time:1:25 AM  Gender:Female  Living status:Living  Apgars:9 ,9  Weight:2670 g   Magnesium Sulfate received: No BMZ received: No Rhophylac:N/A MMR:N/A T-DaP: declined Flu: declined Transfusion:No  Physical exam  Vitals:   11/12/21 2145 11/13/21 0540 11/13/21 2052 11/14/21 0532  BP: 119/62 123/69 113/63 113/70  Pulse: 80 (!) 105 81 93  Resp: '17 17 18 18  ' Temp: 98.2 F (36.8 C) 98 F (36.7 C) 99 F (37.2 C) 98.1 F (36.7 C)  TempSrc: Oral Oral Oral Oral  SpO2: 100% 100% 100%   Weight:      Height:        General: alert Lochia: appropriate Uterine Fundus: firm Incision: N/A DVT Evaluation: No evidence of DVT seen on physical exam. Labs: Lab Results  Component Value Date   WBC 13.1 (H) 11/10/2021   HGB 10.2 (L) 11/10/2021   HCT 27.6 (L) 11/10/2021   MCV 89.0 11/10/2021   PLT 309 11/10/2021   CMP Latest Ref Rng & Units 04/02/2021  Glucose 70 - 99 mg/dL 78  BUN 6 - 20 mg/dL <5(L)  Creatinine 0.44 - 1.00 mg/dL 0.62  Sodium 135 - 145 mmol/L 133(L)  Potassium 3.5 - 5.1 mmol/L 3.7  Chloride 98 - 111 mmol/L 103  CO2 22 - 32 mmol/L 22  Calcium 8.9 - 10.3 mg/dL 8.9  Total Protein 6.5 - 8.1 g/dL 6.8  Total Bilirubin 0.3 - 1.2 mg/dL 3.2(H)  Alkaline Phos 38 - 126 U/L 28(L)  AST 15 - 41 U/L 18  ALT 0 - 44 U/L 12   Edinburgh Score: Edinburgh Postnatal Depression Scale Screening Tool 11/13/2021  I have been able to laugh and see the funny side of things. (No Data)     After visit meds:  Allergies as of 11/14/2021       Reactions   Shellfish Allergy Hives        Medication List     STOP taking these medications    aspirin EC 81 MG tablet  TAKE these medications    acetaminophen 325 MG tablet Commonly known as: Tylenol Take 2 tablets (650 mg total) by mouth every 4 (four) hours as needed (for pain scale < 4).   albuterol 108 (90 Base) MCG/ACT inhaler Commonly known as: VENTOLIN HFA Inhale 1-2 puffs into the lungs every 6 (six) hours as needed for wheezing or shortness of breath.   Blood Pressure Kit Devi 1 kit by Does not apply route once a week. Check Blood Pressure regularly and record readings into the Babyscripts App.  Large Cuff.  DX O90.0   ferrous sulfate 325 (65 FE) MG tablet Take 1 tablet (325 mg total) by mouth every other day.   ibuprofen 600 MG tablet Commonly known as: ADVIL Take 1 tablet (600 mg total) by mouth every 6 (six) hours.   PrePLUS 27-1 MG Tabs Take 1 tablet by mouth daily.         Discharge home in stable  condition Infant Feeding: Breast Infant Disposition:rooming in and under loghts Discharge instruction: per After Visit Summary and Postpartum booklet. Activity: Advance as tolerated. Pelvic rest for 6 weeks.  Diet: routine diet Future Appointments: Future Appointments  Date Time Provider Becker  12/12/2021  3:10 PM Shelly Bombard, MD [place] None   Follow up Visit: Message sent to [facility] by Dr. Cy Blamer on 12/20  Please schedule this patient for a In person postpartum visit in 4 weeks with the following provider: Any provider. Additional Postpartum F/U: None   High risk pregnancy complicated by:  FGR Delivery mode:  Vaginal, Spontaneous  Anticipated Birth Control:   oupatient IUD  Renard Matter, MD, MPH OB Fellow, [facility]

## 2021-11-12 NOTE — Anesthesia Postprocedure Evaluation (Signed)
Anesthesia Post Note  Patient: Kari Neal  Procedure(s) Performed: AN AD HOC LABOR EPIDURAL     Patient location during evaluation: Mother Baby Anesthesia Type: Epidural Level of consciousness: awake and alert and oriented Pain management: satisfactory to patient Vital Signs Assessment: post-procedure vital signs reviewed and stable Respiratory status: respiratory function stable Cardiovascular status: stable Postop Assessment: no headache, no backache, epidural receding, patient able to bend at knees, no signs of nausea or vomiting, adequate PO intake and able to ambulate Anesthetic complications: no   No notable events documented.  Last Vitals:  Vitals:   11/12/21 0510 11/12/21 0824  BP: 123/76 (!) 117/57  Pulse: 94 74  Resp: 18 18  Temp: 37.2 C 37 C  SpO2: 100% 100%    Last Pain:  Vitals:   11/12/21 0824  TempSrc: Oral  PainSc: 5    Pain Goal:                Epidural/Spinal Function Cutaneous sensation: Normal sensation (11/12/21 0824), Patient able to flex knees: Yes (11/12/21 0824), Patient able to lift hips off bed: Yes (11/12/21 0824), Back pain beyond tenderness at insertion site: No (11/12/21 0824), Progressively worsening motor and/or sensory loss: No (11/12/21 0824), Bowel and/or bladder incontinence post epidural: No (11/12/21 0824)  Karleen Dolphin

## 2021-11-13 NOTE — Lactation Note (Signed)
This note was copied from a baby's chart. Lactation Consultation Note RN asked LC to see mom. RN put LC order in. Mom has been supplementing w/DM via curve tip syring. LC asked if baby is going to the breast, mom stated yes but he wont suck once he latches. Asked mom if I could assist her and see latch. Mom had curve tip syring giving DM. Encouraged mom to give curve tip syring/DM w/gloved finger, not to squirt milk in baby's mouth w/o the baby suckling on gloved finger d/t risk of aspiration. Mom stated OK they did show her. LC instructed on how to feed w/curve tip syring safety. Mom thanked LC. Discussed w/mom pumping. Mom agreed. Mom shown how to use DEBP & how to disassemble, clean, & reassemble parts. Mom knows to pump q3h for 15-20 min.  Sheet given about supplementing and how much to give. Encouraged mom to call for assistance in latching. Newborn feeding habits discussed. Lactation brochure given.  Patient Name: Kari Neal PZWCH'E Date: 11/13/2021 Reason for consult: Initial assessment;Primapara;Infant < 6lbs;Term Age:103 hours  Maternal Data Does the patient have breastfeeding experience prior to this delivery?: No  Feeding Mother's Current Feeding Choice: Breast Milk and Donor Milk  LATCH Score       Type of Nipple: Everted at rest and after stimulation  Comfort (Breast/Nipple): Soft / non-tender         Lactation Tools Discussed/Used Tools: Pump Breast pump type: Double-Electric Breast Pump Pump Education: Setup, frequency, and cleaning;Milk Storage Reason for Pumping: less than 6 lbs Pumping frequency: Q 3 hrs  Interventions Interventions: DEBP;Breast feeding basics reviewed  Discharge    Consult Status Consult Status: Follow-up Date: 11/13/21 Follow-up type: In-patient    Tammie Ellsworth, Diamond Nickel 11/13/2021, 3:21 AM

## 2021-11-13 NOTE — Progress Notes (Signed)
POSTPARTUM PROGRESS NOTE  Subjective: Kari Neal is a 20 y.o. G1P1001 PPD#1 s/p SVD at [redacted]w[redacted]d.  She reports she doing well. No acute events overnight. She denies any problems with ambulating, voiding or po intake. Denies nausea or vomiting. She has  passed flatus. Pain is well controlled.  Lochia is Scant.  Reports she would like to stay till tomorrow.  Objective: Blood pressure 123/69, pulse (!) 105, temperature 98 F (36.7 C), temperature source Oral, resp. rate 17, height 5\' 1"  (1.549 m), weight 85.2 kg, last menstrual period 02/10/2021, SpO2 100 %, unknown if currently breastfeeding.  Physical Exam:  General: alert, cooperative and no distress Chest: no respiratory distress Abdomen: soft, non-tender  Uterine Fundus: firm, appropriately tender Extremities: No calf swelling or tenderness   no edema  Recent Labs    11/10/21 2130  HGB 10.2*  HCT 27.6*    Assessment/Plan: Kari Neal is a 20 y.o. G1P1001 PPD#1 s/p SVD at [redacted]w[redacted]d  Routine Postpartum Care: Doing well, pain well-controlled.  -- Continue routine care, lactation support  -- Contraception: PP IUD at 6 weeks -- Feeding: breast  Dispo: Plan for discharge PPD#2.  [redacted]w[redacted]d, MD, MPH OB Fellow, [facility] for [facility]

## 2021-11-14 MED ORDER — IBUPROFEN 600 MG PO TABS: 600.0000 mg | ORAL_TABLET | Freq: Four times a day (QID) | ORAL | 0 refills | Status: DC

## 2021-11-14 MED ORDER — ACETAMINOPHEN 325 MG PO TABS
650.0000 mg | ORAL_TABLET | ORAL | 0 refills | Status: AC | PRN
Start: 2021-11-14 — End: ?

## 2021-11-14 NOTE — Social Work (Signed)
CSW acknowledged consult and attempted to meet with MOB. However, MOB asleep upon arrival. CSW will meet with MOB at a later time.   Vivi Barrack, MSW, LCSW [facility]  Clinical Social Worker  [phone] 11/14/2021  11:20 AM

## 2021-11-14 NOTE — Social Work (Addendum)
CSW received consult for Edinburgh 15. CSW met with MOB to offer support and complete assessment.   ° °CSW met with MOB at bedside and introduced CSW role. CSW observed MOB up in the room, the infant asleep in the bassinet and FOB present. CSW offered MOB privacy. MOB presented pleasant and welcomed CSW visit with FOB present. FOB went into the bathroom to take a call which allowed MOB privacy. CSW inquired how MOB has felt since giving birth. MOB reported feeling excited about the baby.  MOB shared she has had some anxiety as it relates to breastfeeding but feels very supported by the lactation team. CSW discussed MOB Edinburgh and inquired how MOB felt during the pregnancy. MOB reported she felt okay most of time but did have some anxiety anticipating the delivery and being a new mom. CSW validated and normalized MOB concerns. MOB reported she did not have energy to do things she would normally enjoy like go to her grandmother's birthday celebration. MOB reported because of her lack of energy she felt miserable with bouts of crying and difficulty sleeping.  MOB reported feeling panicky and shared that she has anxiety symptoms often but has not been officially diagnosed. MOB reported a family history of "severe anxiety" and reported seeing a therapist in 2020 for about a month before closing. MOB reported she is open to seeing a therapist again. MOB expressed using coping strategies such as getting out the house and going places with FOB. MOB acknowledged FOB and her family as supports. MOB reported she is also connected with the [employer]age [employer] program for community support. MOB denied SI/HI.  ° °MOB was knowledgeable of PPD after seeing her friend experience PPD. CSW provided education regarding the baby blues period vs. perinatal mood disorders, discussed treatment and gave resources for mental health follow up. CSW recommended MOB complete a self-evaluation during the postpartum time period using the New  Mom Checklist from Postpartum Progress and encouraged MOB to contact a medical professional if symptoms are noted at any time. MOB reported she feels comfortable reaching out to her doctor if she has concerns.  ° °CSW provided review of Sudden Infant Death Syndrome (SIDS) precautions and MOB reported understanding. MOB reported she has essential items for the infant including a bassinet where the infant will sleep. MOB has chosen [facility] for infant's follow care and will have transportation to appointments. CSW educated MOB about [facility] and provided a brochure. MOB asked to review the brochure and will call and follow up if desired.  ° °CSW identifies no further need for intervention and no barriers to discharge at this time.  ° °Evertte Sones, MSW, LCSW °[facility]  °Clinical Social Worker  °[phone] °11/14/2021  3:45 PM °

## 2021-11-14 NOTE — Lactation Note (Addendum)
This note was copied from a baby's chart. Lactation Consultation Note  Patient Name: Kari Neal [id number] Date: 11/14/2021   Age:19 hours  Infant was put to breast, but infant was sleepy/not sucking vigorously enough for milk transfer. Infant was bottle-fed with the white Nfant Standard flow nipple (parents report that with the yellow Similac nipple, infant was gulping with possible "choking" as described by parents). Infant did well with the Nfant nipple, but did require some pacing, which was shown to parents.  Hand expression was taught to Mom with good results. Mom's breasts are filling. Mom to pump and increase suction as tolerant.   Plan: If Mom puts infant to breast and he is too sleepy/not sucking vigorously, remove from breast and bottle feed. Pump.  If infant feeds well, Mom can still offer bottle for supplementing purposes, if needed.   Mom has a wearable pump at home.  Parents pleased with consult.   Lurline Hare [facility] 11/14/2021, 1:57 PM

## 2021-11-15 ENCOUNTER — Ambulatory Visit: Payer: Self-pay

## 2021-11-15 NOTE — Lactation Note (Signed)
This note was copied from a baby's chart. Lactation Consultation Note  Patient Name: Kari Neal DDUKG'U Date: 11/15/2021 Reason for consult: Follow-up assessment;Mother's request;Term;Infant weight loss;Breastfeeding assistance;Hyperbilirubinemia Age:19 days  LC encouraged Mom to give more volume given infant not latching at the breast with every feeding. Last feeding infant took 40 ml.   Mom using personal pump and getting about 20 ml per pumping session. Mom denied any pain with current flange size.   Mom breasts are soft and nipples are round, no signs of compression stripes.   LC reviewed different latch positions to get more depth on the breast. Infant recent feeding and did not latch.   Mom stated infant more urine and stool output throughout the day 2 stool and 1 urine since 10 am.   LPTI guidelines provided with instructions to reduce calorie loss including keeping total feeding under 30 min.   Plan 1. To feed based on cues 8-12x 24hr period. Mom to offer breasts and look for signs of milk transfer.  2. Mom to supplement with EBM first followed by [facility] over 30 ml. Mom aware to increase volume if infant not latching at the breast for each feeding.  3. Mom to pump with DEBP q 3hrs for 15 min All questions answered at the end  of the visit.   Maternal Data    Feeding Mother's Current Feeding Choice: Breast Milk and Donor Milk  LATCH Score                    Lactation Tools Discussed/Used Tools: Pump;Flanges Breast pump type: Double-Electric Breast Pump (Mom using her personal electric pump and denies any pain with current flange size.) Pump Education: Setup, frequency, and cleaning;Milk Storage Reason for Pumping: every 3 hrs for 15 min Pumping frequency: every 3 hrs for 15 min  Interventions Interventions: Breast feeding basics reviewed;Assisted with latch;Breast compression;Adjust position;Support pillows;Position options;Expressed  milk;DEBP;Education;Pace feeding;LC Psychologist, educational;Infant Driven Feeding Algorithm education  Discharge Discharge Education: Engorgement and breast care;Warning signs for feeding baby;Outpatient recommendation Pump: Personal  Consult Status Consult Status: Follow-up Date: 11/16/21 Follow-up type: In-patient    Kari Arther  Neal 11/15/2021, 3:50 PM

## 2021-11-23 ENCOUNTER — Telehealth (HOSPITAL_COMMUNITY): Payer: Self-pay

## 2021-11-23 NOTE — Telephone Encounter (Signed)
"  I'm good. I'm doing ok." Patient has no questions or concerns about her healing.  "He's good. Eating and gaining weight. He sleeps in his bassinet." RN reviewed ABC's of safe sleep with patient. Patient declines any questions or concerns about baby.  EPDS score 4.  Marcelino Duster [facility] 11/23/2021,1157

## 2021-12-12 ENCOUNTER — Encounter: Payer: Self-pay | Admitting: Obstetrics

## 2021-12-12 ENCOUNTER — Other Ambulatory Visit: Payer: Self-pay

## 2021-12-12 ENCOUNTER — Ambulatory Visit (INDEPENDENT_AMBULATORY_CARE_PROVIDER_SITE_OTHER): Payer: Medicaid Other | Admitting: Obstetrics

## 2021-12-12 DIAGNOSIS — Z3009 Encounter for other general counseling and advice on contraception: Secondary | ICD-10-CM

## 2021-12-12 DIAGNOSIS — Z30016 Encounter for initial prescription of transdermal patch hormonal contraceptive device: Secondary | ICD-10-CM | POA: Diagnosis not present

## 2021-12-12 MED ORDER — XULANE 150-35 MCG/24HR TD PTWK: 1.0000 | MEDICATED_PATCH | TRANSDERMAL | 12 refills | Status: DC

## 2021-12-12 NOTE — Progress Notes (Signed)
Post Partum Visit Note  Kari Neal is a 21 y.o. G45P1001 female who presents for a postpartum visit. She is 4 weeks postpartum following a normal spontaneous vaginal delivery.  I have fully reviewed the prenatal and intrapartum course. The delivery was at 39.2 gestational weeks.  Anesthesia: epidural. Postpartum course has been uncomplicated. Baby is doing well. Baby is feeding by bottle - Enfamil . Bleeding moderate lochia. Bowel function is normal. Bladder function is normal. Patient is not sexually active. Contraception method is abstinence.  Pt is interested in patch for [facility].   Postpartum depression screening: negative, score 4.   The pregnancy intention screening data noted above was reviewed. Potential methods of contraception were discussed. The patient elected to proceed with No data recorded.   Edinburgh Postnatal Depression Scale - 12/12/21 1513       Edinburgh Postnatal Depression Scale:  In the Past 7 Days   I have been able to laugh and see the funny side of things. 0    I have looked forward with enjoyment to things. 0    I have blamed myself unnecessarily when things went wrong. 1    I have been anxious or worried for no good reason. 1    I have felt scared or panicky for no good reason. 1    Things have been getting on top of me. 1    I have been so unhappy that I have had difficulty sleeping. 0    I have felt sad or miserable. 0    I have been so unhappy that I have been crying. 0    The thought of harming myself has occurred to me. 0    Edinburgh Postnatal Depression Scale Total 4             Health Maintenance Due  Topic Date Due   COVID-19 Vaccine (1) Never done   HPV VACCINES (1 - 2-dose series) Never done    The following portions of the patient's history were reviewed and updated as appropriate: allergies, current medications, past family history, past medical history, past social history, past surgical history, and problem list.  Review of  Systems A comprehensive review of systems was negative.  Objective:  BP 123/73    Pulse (!) 104    Wt 179 lb (81.2 kg)    LMP 02/10/2021    BMI 33.82 kg/m    General:  alert and no distress   Breasts:  normal  Lungs: clear to auscultation bilaterally  Heart:  regular rate and rhythm, S1, S2 normal, no murmur, click, rub or gallop  Abdomen: soft, non-tender; bowel sounds normal; no masses,  no organomegaly   Wound none  GU exam:  not indicated       Assessment:    1. Postpartum care following vaginal delivery - doing well  2. Encounter for other general counseling and advice on contraception - wants the Patch  3. Encounter for initial prescription of transdermal patch hormonal contraceptive device Rx: - norelgestromin-ethinyl estradiol [employer]) 150-35 MCG/24HR transdermal patch; Place 1 patch onto the skin once a week.  Dispense: 3 patch; Refill: 12    Plan:   Essential components of care per ACOG recommendations:  1.  Mood and well being: Patient with negative depression screening today.  - Patient tobacco use? No.   - hx of drug use? No.    2. Infant care and feeding:  -Patient currently breastmilk feeding? No.  -Social determinants of health (SDOH) reviewed in  EPIC. No concerns  3. Sexuality, contraception and birth spacing - Patient does not want a pregnancy in the next year.  Desired family size is undecided. - Reviewed forms of contraception in tiered fashion. Patient desired  Xulane Patches Weekly  today.   - Discussed birth spacing of 18 months  4. Sleep and fatigue -Encouraged family/partner/community support of 4 hrs of uninterrupted sleep to help with mood and fatigue  5. Physical Recovery  - Discussed patients delivery and complications. She describes her labor as good. - Patient had a Vaginal, no problems at delivery. Patient had a 2nd degree laceration. Perineal healing reviewed. Patient expressed understanding - Patient has urinary incontinence? No. -  Patient is not safe to resume physical and sexual activity  6.  Health Maintenance - HM due items addressed Yes - Last pap smear No results found for: DIAGPAP Pap smear not done at today's visit.  -Breast Cancer screening indicated? No.   7. Chronic Disease/Pregnancy Condition follow up: None  - PCP follow up  Coral Ceo, MD Center for [facility], [facility], [facility] 12/12/21

## 2022-01-14 ENCOUNTER — Encounter: Payer: Self-pay | Admitting: Obstetrics

## 2022-01-14 ENCOUNTER — Other Ambulatory Visit: Payer: Self-pay

## 2022-01-14 ENCOUNTER — Ambulatory Visit (INDEPENDENT_AMBULATORY_CARE_PROVIDER_SITE_OTHER): Payer: Medicaid Other | Admitting: Obstetrics

## 2022-01-14 DIAGNOSIS — Z3009 Encounter for other general counseling and advice on contraception: Secondary | ICD-10-CM

## 2022-01-14 DIAGNOSIS — Z30016 Encounter for initial prescription of transdermal patch hormonal contraceptive device: Secondary | ICD-10-CM

## 2022-01-14 NOTE — Progress Notes (Signed)
Pt in office today for PP visit. She is 8 weeks postpartum. She has a prescription for the patch but has not started it yet because she has not had her first period yet. She has no concerns at this time.

## 2022-01-14 NOTE — Progress Notes (Signed)
Subjective:     Kari Neal is a 21 y.o. female who presents for a postpartum visit. She is 8 weeks postpartum following a spontaneous vaginal delivery. I have fully reviewed the prenatal and intrapartum course. The delivery was at 39 gestational weeks. Outcome: spontaneous vaginal delivery. Anesthesia: epidural. Postpartum course has been normal. Baby's course has been normal. Baby is feeding by bottle - Enfamil NeuroPro . Bleeding no bleeding. Bowel function is normal. Bladder function is normal. Patient is sexually active. Contraception method is  Financial risk analyst . Postpartum depression screening: negative.  Tobacco, alcohol and substance abuse history reviewed.  Adult immunizations reviewed including TDAP, rubella and varicella.  The following portions of the patient's history were reviewed and updated as appropriate: allergies, current medications, past family history, past medical history, past social history, past surgical history, and problem list.  Review of Systems A comprehensive review of systems was negative.   Objective:    BP 118/72    Pulse 89    Wt 188 lb 8 oz (85.5 kg)    BMI 35.62 kg/m   General:  alert and no distress   Breasts:  inspection negative, no nipple discharge or bleeding, no masses or nodularity palpable  Lungs: clear to auscultation bilaterally  Heart:  regular rate and rhythm, S1, S2 normal, no murmur, click, rub or gallop  Abdomen: soft, non-tender; bowel sounds normal; no masses,  no organomegaly   Vulva:  Not examined  Vagina:  Not examined  Cervix:  Not examined  Corpus: not examined  Adnexa:  not evaluated  Rectal Exam: Not performed.          I have spent a total of 20 minutes of face-to-face time, excluding clinical staff time, reviewing notes and preparing to see patient, ordering tests and/or medications, and counseling the patient.   Assessment:    1. Postpartum care following vaginal delivery - doing well  2. Encounter for other general  counseling and advice on contraception - she has been Rx the [employer] and will start it with her period    Plan:    1. Contraception:  Xulane Patch 2. Continue PNV's and iron 3. Follow up in: 4 weeks or as needed.   Brock Bad, MD 01/14/2022 4:18 PM

## 2022-02-11 ENCOUNTER — Encounter: Payer: Self-pay | Admitting: Obstetrics

## 2022-02-11 ENCOUNTER — Ambulatory Visit (INDEPENDENT_AMBULATORY_CARE_PROVIDER_SITE_OTHER): Payer: Medicaid Other | Admitting: Obstetrics

## 2022-02-11 ENCOUNTER — Other Ambulatory Visit: Payer: Self-pay

## 2022-02-11 VITALS — BP 118/75 | HR 89 | Ht 61.0 in | Wt 190.0 lb

## 2022-02-11 DIAGNOSIS — Z30016 Encounter for initial prescription of transdermal patch hormonal contraceptive device: Secondary | ICD-10-CM

## 2022-02-11 DIAGNOSIS — G44209 Tension-type headache, unspecified, not intractable: Secondary | ICD-10-CM | POA: Diagnosis not present

## 2022-02-11 DIAGNOSIS — Z3009 Encounter for other general counseling and advice on contraception: Secondary | ICD-10-CM | POA: Diagnosis not present

## 2022-02-11 DIAGNOSIS — E669 Obesity, unspecified: Secondary | ICD-10-CM

## 2022-02-11 DIAGNOSIS — D508 Other iron deficiency anemias: Secondary | ICD-10-CM

## 2022-02-11 MED ORDER — XULANE 150-35 MCG/24HR TD PTWK: 1.0000 | MEDICATED_PATCH | TRANSDERMAL | 12 refills | Status: DC

## 2022-02-11 MED ORDER — XULANE 150-35 MCG/24HR TD PTWK: MEDICATED_PATCH | TRANSDERMAL | 12 refills | Status: AC

## 2022-02-11 MED ORDER — CITRANATAL BLOOM 90-1 MG PO TABS: 1.0000 | ORAL_TABLET | Freq: Every day | ORAL | 4 refills | Status: DC

## 2022-02-11 NOTE — Progress Notes (Signed)
Subjective:  ? ? Kari Neal is a 21 y.o. female who presents for contraception counseling. The patient has no complaints today. The patient is sexually active. Pertinent past medical history: recent headaches ? ?The information documented in the HPI was reviewed and verified. ? ?Menstrual History: ?OB History   ? ? Gravida  ?1  ? Para  ?1  ? Term  ?1  ? Preterm  ?   ? AB  ?   ? Living  ?1  ?  ? ? SAB  ?   ? IAB  ?   ? Ectopic  ?   ? Multiple  ?0  ? Live Births  ?1  ?   ?  ?  ?  ?Menarche age:  ?No LMP recorded. ?  ?Patient Active Problem List  ? Diagnosis Date Noted  ? History of cholecystectomy 11/11/2021  ? Anemia affecting pregnancy, antepartum 05/24/2021  ? Low thyroid stimulating hormone (TSH) level 05/24/2021  ? BMI 35.0-35.9,adult 05/24/2021  ? Asthma 04/26/2021  ? Supervision of high-risk pregnancy, third trimester 04/16/2021  ? Serum total bilirubin elevated 04/13/2021  ? Abdominal pain, recurrent   ? ?Past Medical History:  ?Diagnosis Date  ? Abdominal pain, recurrent   ? Knee crepitus   ?  ?Past Surgical History:  ?Procedure Laterality Date  ? CHOLECYSTECTOMY N/A 01/13/2019  ? Procedure: LAPAROSCOPIC CHOLECYSTECTOMY WITH INTRAOPERATIVE CHOLANGIOGRAM ERAS PATHWAY;  Surgeon: Alphonsa Overall, MD;  Location: [facility];  Service: General;  Laterality: N/A;  ?  ? ?Current Outpatient Medications:  ?  Prenatal-DSS-FeCb-FeGl-FA (CITRANATAL BLOOM) 90-1 MG TABS, Take 1 tablet by mouth daily before breakfast., Disp: 90 tablet, Rfl: 4 ?  acetaminophen (TYLENOL) 325 MG tablet, Take 2 tablets (650 mg total) by mouth every 4 (four) hours as needed (for pain scale < 4)., Disp: 60 tablet, Rfl: 0 ?  albuterol (VENTOLIN HFA) 108 (90 Base) MCG/ACT inhaler, Inhale 1-2 puffs into the lungs every 6 (six) hours as needed for wheezing or shortness of breath., Disp: 1 each, Rfl: 0 ?  Blood Pressure Monitoring (BLOOD PRESSURE KIT) DEVI, 1 kit by Does not apply route once a week. Check Blood Pressure regularly and record readings into the  Babyscripts App.  Large Cuff.  DX O90.0 (Patient taking differently: 1 kit by Does not apply route once a week. Check Blood Pressure regularly and record readings into the Babyscripts App.  Large Cuff.  DX O90.0), Disp: 1 each, Rfl: 0 ?  ibuprofen (ADVIL) 600 MG tablet, Take 1 tablet (600 mg total) by mouth every 6 (six) hours. (Patient not taking: Reported on 02/11/2022), Disp: 30 tablet, Rfl: 0 ?  norelgestromin-ethinyl estradiol Marilu Favre) 150-35 MCG/24HR transdermal patch, Starting the Sunday after a normal period, apply patch to skin as directed once a week for 3 weeks, then leave off for 1 week.each month for menstruation., Disp: 3 patch, Rfl: 12 ?Allergies  ?Allergen Reactions  ? Shellfish Allergy Hives  ?  ?Social History  ? ?Tobacco Use  ? Smoking status: Former  ?  Types: E-cigarettes  ?  Start date: 04/26/2019  ?  Passive exposure: Current  ? Smokeless tobacco: Never  ?Substance Use Topics  ? Alcohol use: Not Currently  ?  ?Family History  ?Problem Relation Age of Onset  ? Anxiety disorder Mother   ? Miscarriages / [place] Mother   ? Diabetes Paternal Aunt   ? Cancer Maternal Grandmother   ? Breast cancer Maternal Grandmother   ? Hypertension Maternal Grandmother   ? Miscarriages / [place]  Maternal Grandmother   ? Diabetes Maternal Grandfather   ? Arthritis Paternal Grandmother   ?  ? ? ? ?Review of Systems ?Constitutional: negative for weight loss ?Genitourinary:negative for abnormal menstrual periods and vaginal discharge ? ? ?Objective:  ? ?BP 118/75   Pulse 89   Ht '5\' 1"'  (1.549 m)   Wt 190 lb (86.2 kg)   BMI 35.90 kg/m?  ?  ?General:   Alert and no distress  ?Skin:   no rash or abnormalities  ?Lungs:   clear to auscultation bilaterally  ?Heart:   regular rate and rhythm, S1, S2 normal, no murmur, click, rub or gallop  ?Breasts:   Not examined  ?The remainder of the physical exam deferred due to the type of encounter. ? ? ?Lab Review ?Urine pregnancy test ?Labs reviewed yes ?Radiologic studies  reviewed no ? ?I have spent a total of 15 minutes of face-to-face time, excluding clinical staff time, reviewing notes and preparing to see patient, ordering tests and/or medications, and counseling the patient.  ? ?Assessment:  ? ? 21 y.o., starting  Xulane Patch weekly , no contraindications.  ? ?Plan:  ? ?1. Encounter for counseling regarding contraception ?- discussed options ?- wants the patch ? ?2. Encounter for initial prescription of transdermal patch hormonal contraceptive device ?Rx: ?- norelgestromin-ethinyl estradiol Marilu Favre) 150-35 MCG/24HR transdermal patch; Starting the Sunday after a normal period, apply patch to skin as directed once a week for 3 weeks, then leave off for 1 week.each month for menstruation.  Dispense: 3 patch; Refill: 12 ? ?3. Tension headache ?- instructed to monitor headaches closely, and to discontinue the patch if headaches become more severe and/or frequent, because of the increased risks of clots and stroke with migraine-like headaches ? ?4. Obesity (BMI 35.0-39.9 without comorbidity) ?- weight reduction with the aid of dietary changes, exercise and behavioral modification recommended ? ?5. Iron deficiency anemia secondary to inadequate dietary iron intake ?Rx: ?- CBC ?- Ferritin ?- Prenatal-DSS-FeCb-FeGl-FA (CITRANATAL BLOOM) 90-1 MG TABS; Take 1 tablet by mouth daily before breakfast.  Dispense: 90 tablet; Refill: 4  ? ? ? All questions answered. ?Contraception: Publishing copy weekly. ?Discussed healthy lifestyle modifications. ?Neurosurgeon distributed. ?Follow up in 3 months. ? ?Meds ordered this encounter  ?Medications  ? DISCONTD: norelgestromin-ethinyl estradiol Marilu Favre) 150-35 MCG/24HR transdermal patch  ?  Sig: Place 1 patch onto the skin once a week.  ?  Dispense:  3 patch  ?  Refill:  12  ? Prenatal-DSS-FeCb-FeGl-FA (CITRANATAL BLOOM) 90-1 MG TABS  ?  Sig: Take 1 tablet by mouth daily before breakfast.  ?  Dispense:  90 tablet  ?  Refill:  4  ?  norelgestromin-ethinyl estradiol Marilu Favre) 150-35 MCG/24HR transdermal patch  ?  Sig: Starting the Sunday after a normal period, apply patch to skin as directed once a week for 3 weeks, then leave off for 1 week.each month for menstruation.  ?  Dispense:  3 patch  ?  Refill:  12  ? ?Orders Placed This Encounter  ?Procedures  ? CBC  ? Ferritin  ? ? ?Shelly Bombard, MD ?02/11/2022 4:39 PM  ?

## 2022-02-12 LAB — CBC
Hematocrit: 33.6 % — ABNORMAL LOW (ref 34.0–46.6)
Hemoglobin: 11.5 g/dL (ref 11.1–15.9)
MCH: 30.7 pg (ref 26.6–33.0)
MCHC: 34.2 g/dL (ref 31.5–35.7)
MCV: 90 fL (ref 79–97)
Platelets: 364 10*3/uL (ref 150–450)
RBC: 3.74 x10E6/uL — ABNORMAL LOW (ref 3.77–5.28)
RDW: 18 % — ABNORMAL HIGH (ref 11.7–15.4)
WBC: 11.7 10*3/uL — ABNORMAL HIGH (ref 3.4–10.8)

## 2022-02-12 LAB — FERRITIN: Ferritin: 123 ng/mL — ABNORMAL HIGH (ref 15–77)

## 2022-12-24 IMAGING — US US OB < 14 WEEKS - US OB TV
1 series · 15 of 28 positions shown · non-contrast
Comparison: None.

CLINICAL DATA: Initial evaluation for acute abdominal pain, early
pregnancy.

EXAM:
OBSTETRIC <14 WK US AND TRANSVAGINAL OB US
TECHNIQUE: Both transabdominal and transvaginal ultrasound examinations were
performed for complete evaluation of the gestation as well as the
maternal uterus, adnexal regions, and pelvic cul-de-sac.
Transvaginal technique was performed to assess early pregnancy.

[Series 1: us ob < 14 weeks - us ob tv · 15 of 62 slices shown]
[im 1/62]
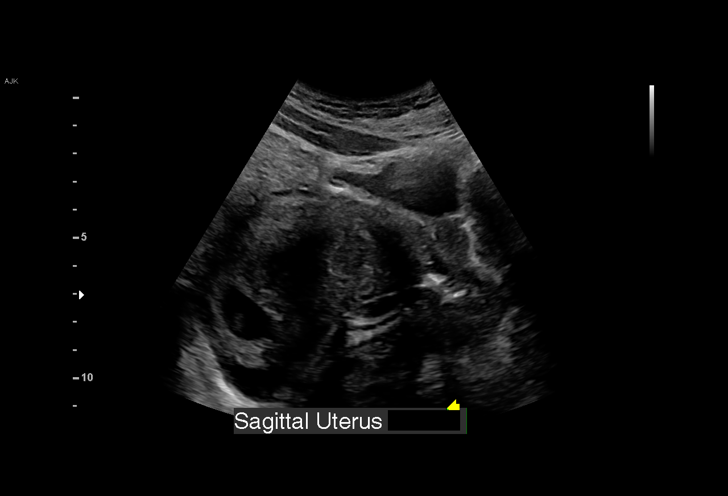
[im 5/62]
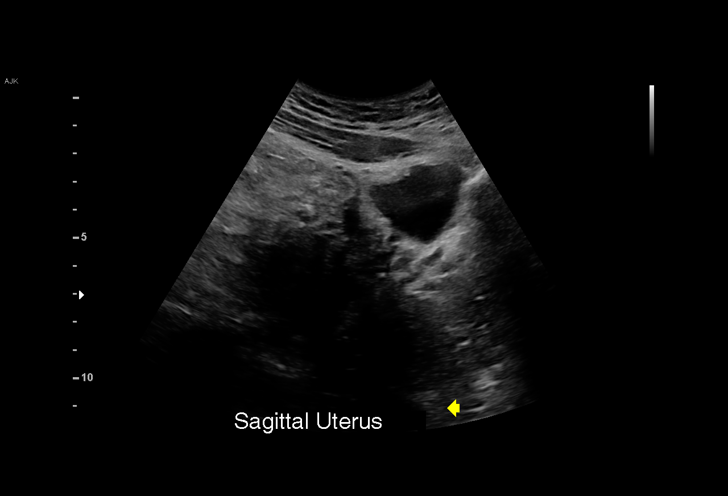
[im 10/62]
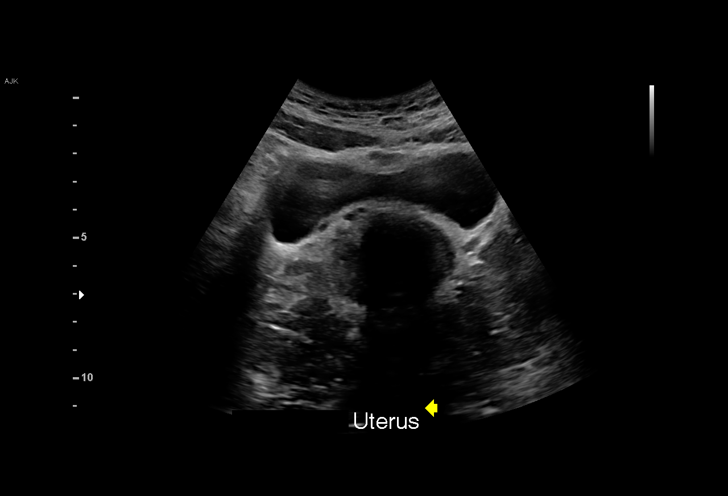
[im 14/62]
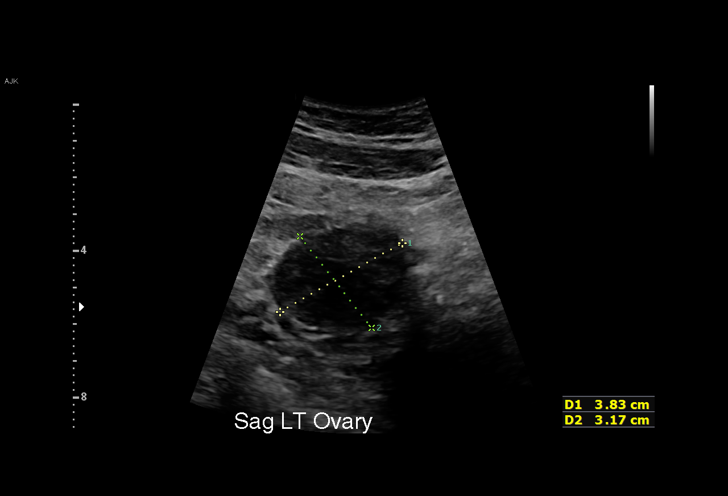
[im 19/62]
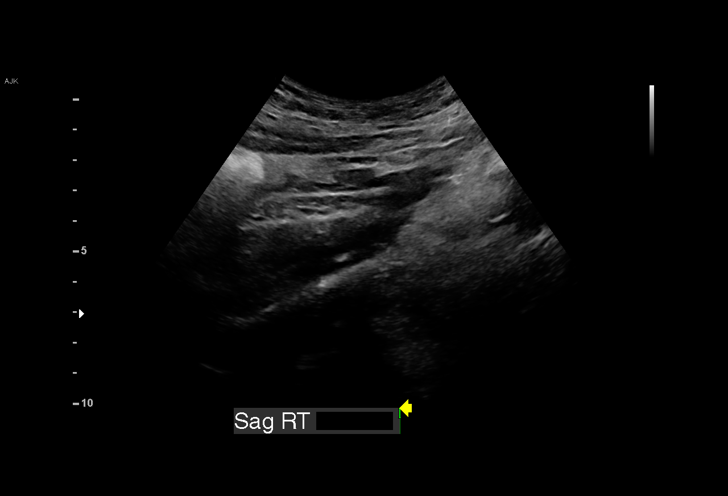
[im 23/62]
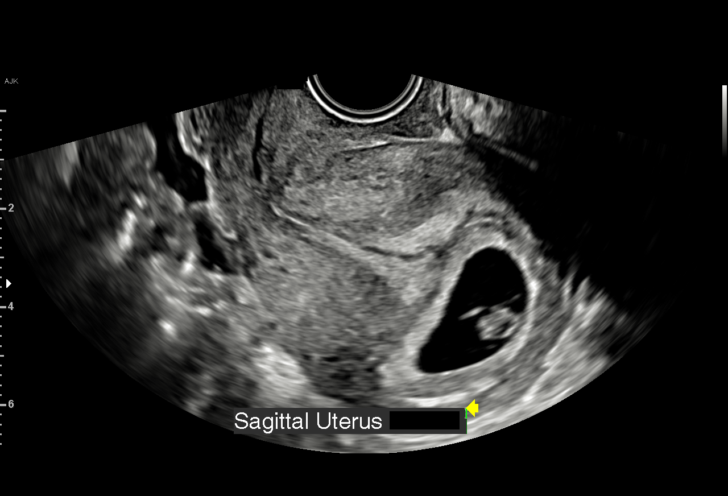
[im 28/62]
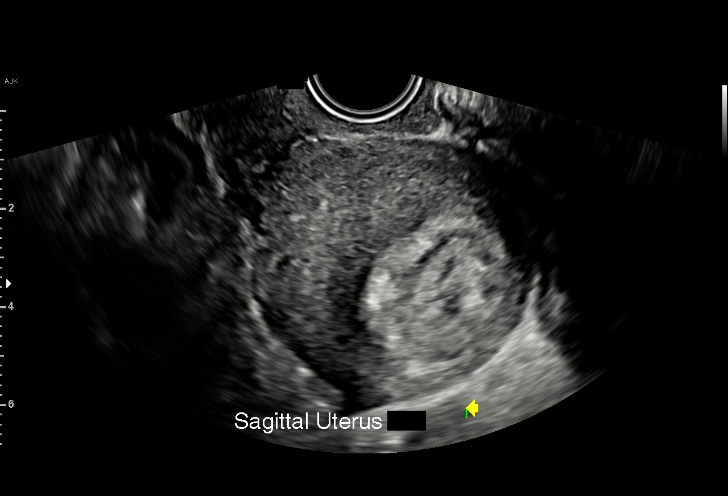
[im 32/62]
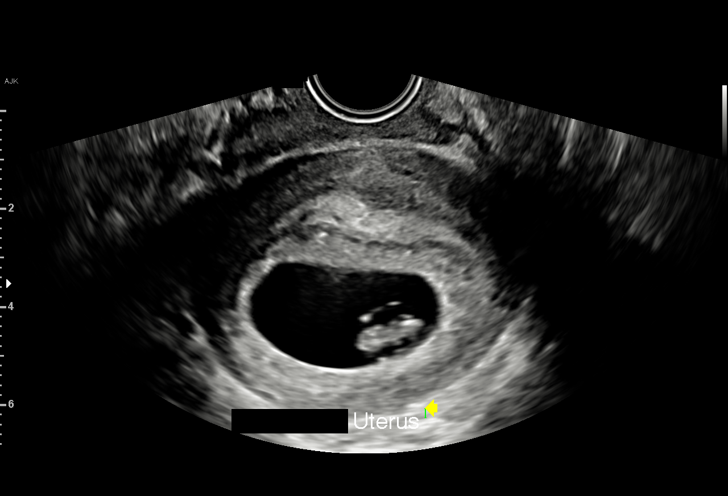
[im 34/62]
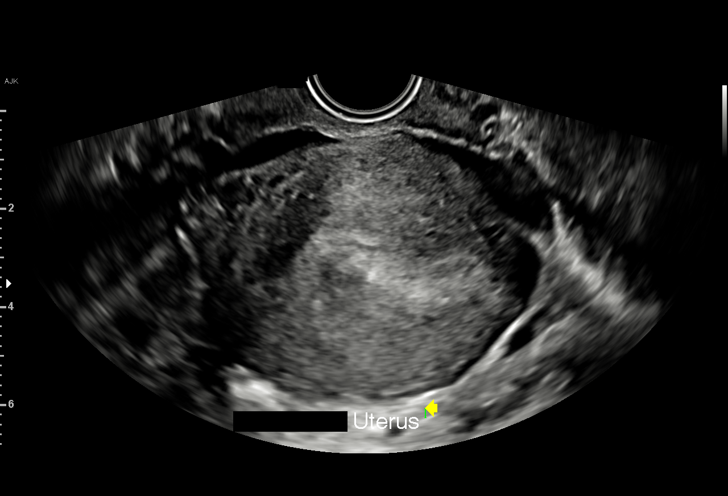
[im 39/62]
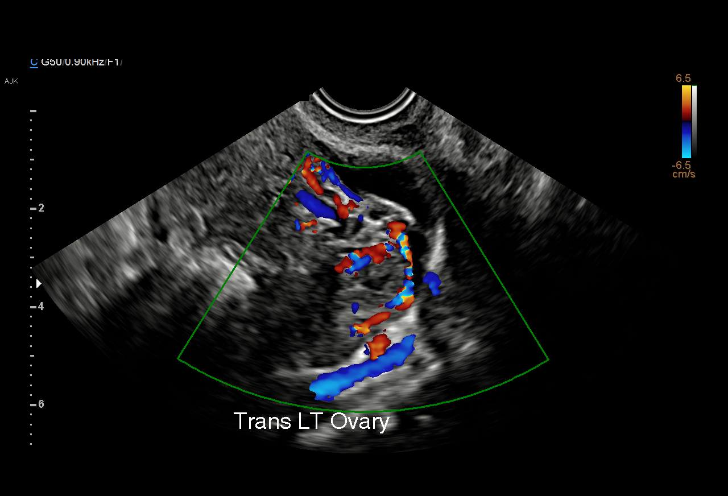
[im 43/62]
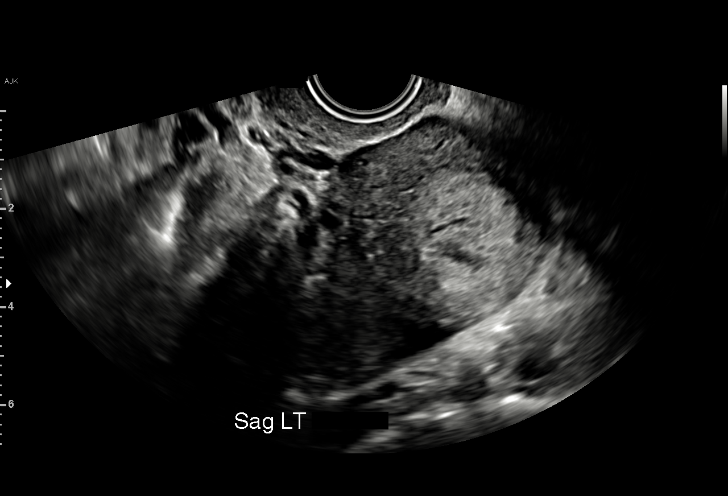
[im 48/62]
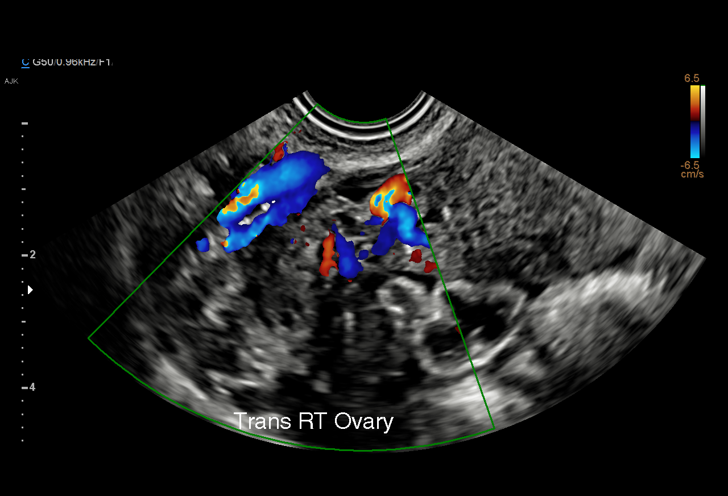
[im 52/62]
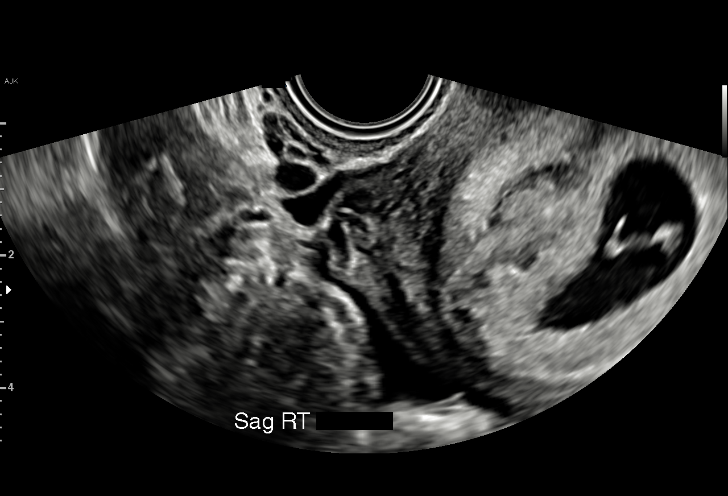
[im 57/62]
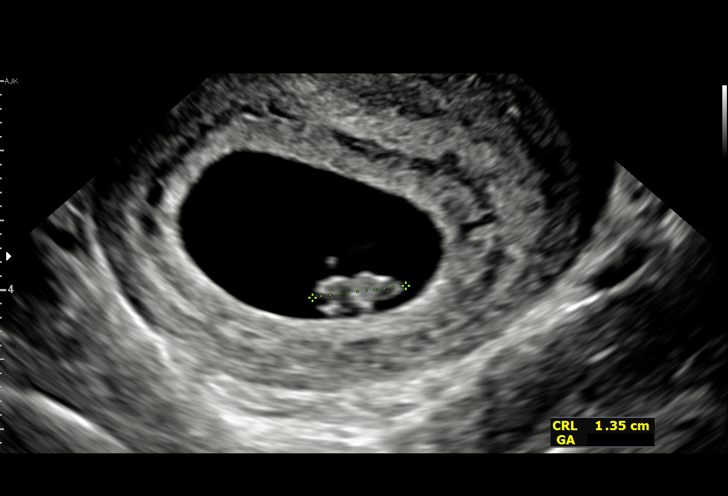
[im 62/62]
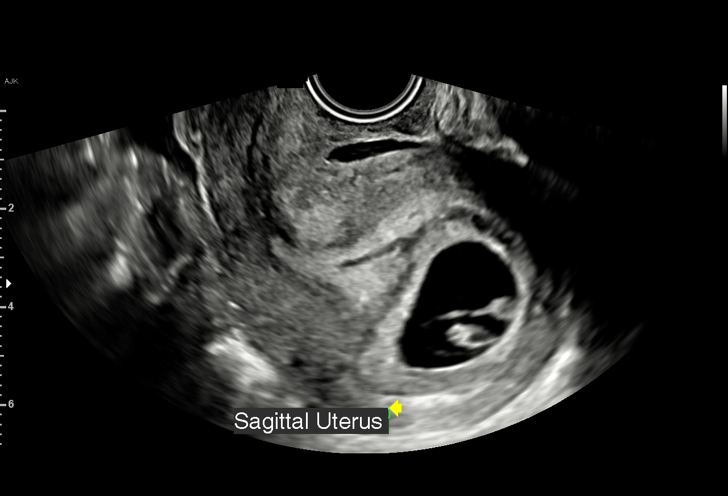

[15 of 28 positions shown; findings below may reference images not displayed]

FINDINGS: Intrauterine gestational sac: Single

Yolk sac:  Present

Embryo:  Present

Cardiac Activity: Present

Heart Rate: 159 bpm

CRL: 13.5 mm   7 w   4 d                  US EDC: 11/15/2021

Subchorionic hemorrhage:  None visualized.

Maternal uterus/adnexae: Ovaries within normal limits. Degenerating
corpus luteal cyst noted on the left. Associated small volume free
fluid. No adnexal mass.
IMPRESSION: 1. Single viable intrauterine pregnancy as above, estimated
gestational age 7 weeks and 4 days by crown-rump length, with
ultrasound EDC of 11/15/2021. No complication.
2. Degenerating left ovarian corpus luteal cyst with associated
small volume free fluid within the pelvis.
3. No other acute maternal uterine or adnexal abnormality.

## 2023-04-07 ENCOUNTER — Other Ambulatory Visit: Payer: Self-pay | Admitting: Family

## 2023-04-07 DIAGNOSIS — R109 Unspecified abdominal pain: Secondary | ICD-10-CM

## 2023-05-07 ENCOUNTER — Ambulatory Visit
Admission: RE | Admit: 2023-05-07 | Discharge: 2023-05-07 | Disposition: A | Discharge status: Home / Self Care | Payer: Medicaid Other | Source: Ambulatory Visit | Attending: Family | Admitting: Family

## 2023-05-07 DIAGNOSIS — R109 Unspecified abdominal pain: Secondary | ICD-10-CM

## 2023-05-16 ENCOUNTER — Ambulatory Visit
Admission: RE | Admit: 2023-05-16 | Discharge: 2023-05-16 | Disposition: A | Discharge status: Home / Self Care | Payer: Medicaid Other | Source: Ambulatory Visit | Attending: Urgent Care | Admitting: Urgent Care

## 2023-05-16 VITALS — BP 109/65 | HR 77 | Temp 99.2°F | Resp 16

## 2023-05-16 DIAGNOSIS — Z113 Encounter for screening for infections with a predominantly sexual mode of transmission: Secondary | ICD-10-CM | POA: Insufficient documentation

## 2023-05-16 NOTE — Discharge Instructions (Addendum)
We will update your results as they come back.  The vaginal cytology results will be available Monday but your blood work should be back tomorrow.  If you see any results that are positive and have not yet had review through our clinic then please feel free to call us and we will do our best to review in detail any positive results.

## 2023-05-16 NOTE — ED Triage Notes (Signed)
Pt requested STD's test. "No reason why I'm getting tested just want to".

## 2023-05-16 NOTE — ED Provider Notes (Signed)
[facility]  Note:  This document was prepared using Conservation officer, historic buildings and may include unintentional dictation errors.  MRN: [id number] DOB: 12/18/2000  Subjective:   Kari Neal is a 22 y.o. female presenting for STI testing.  Patient just got out of a relationship and wants to make sure she gets screened.  She did have unprotected sex with 1 female partner. Denies fever, n/v, abdominal pain, pelvic pain, rashes, dysuria, urinary frequency, hematuria, vaginal discharge.    No current facility-administered medications for this encounter.  Current Outpatient Medications:    acetaminophen (TYLENOL) 325 MG tablet, Take 2 tablets (650 mg total) by mouth every 4 (four) hours as needed (for pain scale < 4)., Disp: 60 tablet, Rfl: 0   albuterol (VENTOLIN HFA) 108 (90 Base) MCG/ACT inhaler, Inhale 1-2 puffs into the lungs every 6 (six) hours as needed for wheezing or shortness of breath., Disp: 1 each, Rfl: 0   Blood Pressure Monitoring (BLOOD PRESSURE KIT) DEVI, 1 kit by Does not apply route once a week. Check Blood Pressure regularly and record readings into the Babyscripts App.  Large Cuff.  DX O90.0 (Patient taking differently: 1 kit by Does not apply route once a week. Check Blood Pressure regularly and record readings into the Babyscripts App.  Large Cuff.  DX O90.0), Disp: 1 each, Rfl: 0   ibuprofen (ADVIL) 600 MG tablet, Take 1 tablet (600 mg total) by mouth every 6 (six) hours. (Patient not taking: Reported on 02/11/2022), Disp: 30 tablet, Rfl: 0   norelgestromin-ethinyl estradiol Burr Medico) 150-35 MCG/24HR transdermal patch, Starting the Sunday after a normal period, apply patch to skin as directed once a week for 3 weeks, then leave off for 1 week.each month for menstruation., Disp: 3 patch, Rfl: 12   Prenatal-DSS-FeCb-FeGl-FA (CITRANATAL BLOOM) 90-1 MG TABS, Take 1 tablet by mouth daily before breakfast., Disp: 90 tablet, Rfl: 4   Allergies  Allergen  Reactions   Shellfish Allergy Hives    Past Medical History:  Diagnosis Date   Abdominal pain, recurrent    Knee crepitus      Past Surgical History:  Procedure Laterality Date   CHOLECYSTECTOMY N/A 01/13/2019   Procedure: LAPAROSCOPIC CHOLECYSTECTOMY WITH INTRAOPERATIVE CHOLANGIOGRAM ERAS PATHWAY;  Surgeon: Ovidio Kin, MD;  Location: [facility];  Service: General;  Laterality: N/A;    Family History  Problem Relation Age of Onset   Anxiety disorder Mother    Miscarriages / Stillbirths Mother    Diabetes Paternal Aunt    Cancer Maternal Grandmother    Breast cancer Maternal Grandmother    Hypertension Maternal Grandmother    Miscarriages / Stillbirths Maternal Grandmother    Diabetes Maternal Grandfather    Arthritis Paternal Grandmother     Social History   Tobacco Use   Smoking status: Former    Types: E-cigarettes    Start date: 04/26/2019    Passive exposure: Current   Smokeless tobacco: Never  Vaping Use   Vaping Use: Never used  Substance Use Topics   Alcohol use: Not Currently   Drug use: No    ROS   Objective:   Vitals: BP 109/65 (BP Location: Right Arm)   Pulse 77   Temp 99.2 F (37.3 C) (Oral)   Resp 16   LMP 04/29/2023 (Exact Date)   SpO2 99%   Breastfeeding No   Physical Exam Constitutional:      General: She is not in acute distress.    Appearance: Normal appearance. She is  well-developed. She is not ill-appearing, toxic-appearing or diaphoretic.  HENT:     Head: Normocephalic and atraumatic.     Nose: Nose normal.     Mouth/Throat:     Mouth: Mucous membranes are moist.  Eyes:     General: No scleral icterus.       Right eye: No discharge.        Left eye: No discharge.     Extraocular Movements: Extraocular movements intact.  Cardiovascular:     Rate and Rhythm: Normal rate.  Pulmonary:     Effort: Pulmonary effort is normal.  Skin:    General: Skin is warm and dry.  Neurological:     General: No focal deficit present.      Mental Status: She is alert and oriented to person, place, and time.  Psychiatric:        Mood and Affect: Mood normal.        Behavior: Behavior normal.     Assessment and Plan :   PDMP not reviewed this encounter.  1. Screen for STD (sexually transmitted disease)    STI check pending, will treat as appropriate based off of lab results.   Wallis Bamberg, [place] 05/16/23 [phone]

## 2023-05-17 LAB — RPR: RPR Ser Ql: NONREACTIVE

## 2023-05-17 LAB — HIV ANTIBODY (ROUTINE TESTING W REFLEX): HIV Screen 4th Generation wRfx: NONREACTIVE

## 2023-05-20 LAB — CERVICOVAGINAL ANCILLARY ONLY
Chlamydia: NEGATIVE
Comment: NEGATIVE
Comment: NEGATIVE
Comment: NORMAL
Neisseria Gonorrhea: NEGATIVE
Trichomonas: NEGATIVE

## 2023-05-25 ENCOUNTER — Other Ambulatory Visit: Payer: Self-pay

## 2023-05-25 ENCOUNTER — Emergency Department (HOSPITAL_COMMUNITY)
Admission: EM | Admit: 2023-05-25 | Discharge: 2023-05-25 | Disposition: A | Discharge status: Home / Self Care | Payer: Medicaid Other | Attending: Emergency Medicine | Admitting: Emergency Medicine

## 2023-05-25 ENCOUNTER — Encounter (HOSPITAL_COMMUNITY): Payer: Self-pay

## 2023-05-25 DIAGNOSIS — Z87891 Personal history of nicotine dependence: Secondary | ICD-10-CM | POA: Insufficient documentation

## 2023-05-25 DIAGNOSIS — G43809 Other migraine, not intractable, without status migrainosus: Secondary | ICD-10-CM | POA: Diagnosis not present

## 2023-05-25 DIAGNOSIS — J45909 Unspecified asthma, uncomplicated: Secondary | ICD-10-CM | POA: Diagnosis not present

## 2023-05-25 DIAGNOSIS — R519 Headache, unspecified: Secondary | ICD-10-CM | POA: Diagnosis present

## 2023-05-25 LAB — COMPREHENSIVE METABOLIC PANEL
ALT: 13 U/L (ref 0–44)
AST: 19 U/L (ref 15–41)
Albumin: 4 g/dL (ref 3.5–5.0)
Alkaline Phosphatase: 36 U/L — ABNORMAL LOW (ref 38–126)
Anion gap: 9 (ref 5–15)
BUN: 6 mg/dL (ref 6–20)
CO2: 22 mmol/L (ref 22–32)
Calcium: 8.7 mg/dL — ABNORMAL LOW (ref 8.9–10.3)
Chloride: 105 mmol/L (ref 98–111)
Creatinine, Ser: 0.63 mg/dL (ref 0.44–1.00)
GFR, Estimated: >60 mL/min (ref >60)
Glucose, Bld: 85 mg/dL (ref 70–99)
Potassium: 3.5 mmol/L (ref 3.5–5.1)
Sodium: 136 mmol/L (ref 135–145)
Total Bilirubin: 3.6 mg/dL — ABNORMAL HIGH (ref 0.3–1.2)
Total Protein: 6.8 g/dL (ref 6.5–8.1)

## 2023-05-25 LAB — CBC
HCT: 27.3 % — ABNORMAL LOW (ref 36.0–46.0)
Hemoglobin: 9.8 g/dL — ABNORMAL LOW (ref 12.0–15.0)
MCH: 33 pg (ref 26.0–34.0)
MCHC: 35.9 g/dL (ref 30.0–36.0)
MCV: 91.9 fL (ref 80.0–100.0)
Platelets: 342 10*3/uL (ref 150–400)
RBC: 2.97 MIL/uL — ABNORMAL LOW (ref 3.87–5.11)
RDW: 17.7 % — ABNORMAL HIGH (ref 11.5–15.5)
WBC: 10.8 10*3/uL — ABNORMAL HIGH (ref 4.0–10.5)
nRBC: 0 % (ref 0.0–0.2)

## 2023-05-25 MED ORDER — DIPHENHYDRAMINE HCL 50 MG/ML IJ SOLN
12.5000 mg | Freq: Once | INTRAMUSCULAR | Status: DC
  Filled 2023-05-25: qty 1

## 2023-05-25 MED ORDER — KETOROLAC TROMETHAMINE 15 MG/ML IJ SOLN
15.0000 mg | Freq: Once | INTRAMUSCULAR | Status: DC
  Filled 2023-05-25: qty 1

## 2023-05-25 MED ORDER — PROCHLORPERAZINE EDISYLATE 10 MG/2ML IJ SOLN
10.0000 mg | Freq: Once | INTRAMUSCULAR | Status: DC
  Filled 2023-05-25: qty 2

## 2023-05-25 MED ORDER — LACTATED RINGERS IV BOLUS: 1000.0000 mL | Freq: Once | INTRAVENOUS | Status: DC

## 2023-05-25 MED ORDER — IBUPROFEN 600 MG PO TABS: 600.0000 mg | ORAL_TABLET | Freq: Four times a day (QID) | ORAL | 0 refills | Status: DC | PRN

## 2023-05-25 NOTE — ED Triage Notes (Signed)
Pt to ED c/o HA x 5 days and n/v that started today, reports HA is getting better, but agitated with light and sound. No s/s of acute distress noted in triage A&O X 4.

## 2023-05-25 NOTE — ED Provider Notes (Signed)
[place] EMERGENCY DEPARTMENT AT [facility] Provider Note  CSN: [id number] Arrival date & time: 05/25/23 1629  Chief Complaint(s) Headache and Emesis  HPI Kari Neal is a 22 y.o. female with prior history of migraines presents to the emergency department headache.  Patient reports headache for 5 days.  Reports that she had episode of vomiting today.  No head trauma.  No vision changes.  No ongoing nausea.  No neck stiffness, fevers or chills.  No chest or abdominal pain.  No trouble swallowing, facial droop, weakness, numbness, tingling.  Reports feels similar to prior migraine.   Past Medical History Past Medical History:  Diagnosis Date   Abdominal pain, recurrent    Knee crepitus    Patient Active Problem List   Diagnosis Date Noted   History of cholecystectomy 11/11/2021   Anemia affecting pregnancy, antepartum 05/24/2021   Low thyroid stimulating hormone (TSH) level 05/24/2021   BMI 35.0-35.9,adult 05/24/2021   Asthma 04/26/2021   Supervision of high-risk pregnancy, third trimester 04/16/2021   Serum total bilirubin elevated 04/13/2021   Abdominal pain, recurrent    Home Medication(s) Prior to Admission medications   Medication Sig Start Date End Date Taking? Authorizing Provider  ibuprofen (ADVIL) 600 MG tablet Take 1 tablet (600 mg total) by mouth every 6 (six) hours as needed. 05/25/23  Yes Lonell Grandchild, MD  acetaminophen (TYLENOL) 325 MG tablet Take 2 tablets (650 mg total) by mouth every 4 (four) hours as needed (for pain scale < 4). 11/14/21   Warner Mccreedy, MD  albuterol (VENTOLIN HFA) 108 (90 Base) MCG/ACT inhaler Inhale 1-2 puffs into the lungs every 6 (six) hours as needed for wheezing or shortness of breath. 04/26/21   Rasch, Victorino Dike I, NP  Blood Pressure Monitoring (BLOOD PRESSURE KIT) DEVI 1 kit by Does not apply route once a week. Check Blood Pressure regularly and record readings into the Babyscripts App.  Large Cuff.  DX O90.0 Patient taking  differently: 1 kit by Does not apply route once a week. Check Blood Pressure regularly and record readings into the Babyscripts App.  Large Cuff.  DX O90.0 08/16/21   Brock Bad, MD  norelgestromin-ethinyl estradiol Burr Medico) 150-35 MCG/24HR transdermal patch Starting the Sunday after a normal period, apply patch to skin as directed once a week for 3 weeks, then leave off for 1 week.each month for menstruation. 02/11/22   Brock Bad, MD  Prenatal-DSS-FeCb-FeGl-FA [facility] BLOOM) 90-1 MG TABS Take 1 tablet by mouth daily before breakfast. 02/11/22   Brock Bad, MD                                                                                                                                    Past Surgical History Past Surgical History:  Procedure Laterality Date   CHOLECYSTECTOMY N/A 01/13/2019   Procedure: LAPAROSCOPIC CHOLECYSTECTOMY WITH INTRAOPERATIVE CHOLANGIOGRAM ERAS PATHWAY;  Surgeon: Ovidio Kin, MD;  Location:  [facility] OR;  Service: General;  Laterality: N/A;   Family History Family History  Problem Relation Age of Onset   Anxiety disorder Mother    Miscarriages / [place] Mother    Diabetes Paternal Aunt    Cancer Maternal Grandmother    Breast cancer Maternal Grandmother    Hypertension Maternal Grandmother    Miscarriages / Stillbirths Maternal Grandmother    Diabetes Maternal Grandfather    Arthritis Paternal Grandmother     Social History Social History   Tobacco Use   Smoking status: Former    Types: E-cigarettes    Start date: 04/26/2019    Passive exposure: Current   Smokeless tobacco: Never  Vaping Use   Vaping Use: Never used  Substance Use Topics   Alcohol use: Not Currently   Drug use: No   Allergies Shellfish allergy  Review of Systems Review of Systems  All other systems reviewed and are negative.   Physical Exam Vital Signs  I have reviewed the triage vital signs BP 114/73   Pulse 72   Temp (!) 97.5 F (36.4 C) (Oral)    Resp 15   Ht 5\' 1"  (1.549 m)   Wt 82.6 kg   LMP 05/25/2023 (Exact Date)   SpO2 100%   BMI 34.39 kg/m  Physical Exam Vitals and nursing note reviewed.  Constitutional:      General: She is not in acute distress.    Appearance: She is well-developed.  HENT:     Head: Normocephalic and atraumatic.     Mouth/Throat:     Mouth: Mucous membranes are moist.  Eyes:     Pupils: Pupils are equal, round, and reactive to light.  Cardiovascular:     Rate and Rhythm: Normal rate and regular rhythm.     Heart sounds: No murmur heard. Pulmonary:     Effort: Pulmonary effort is normal. No respiratory distress.     Breath sounds: Normal breath sounds.  Abdominal:     General: Abdomen is flat.     Palpations: Abdomen is soft.     Tenderness: There is no abdominal tenderness.  Musculoskeletal:        General: No tenderness.     Right lower leg: No edema.     Left lower leg: No edema.  Skin:    General: Skin is warm and dry.  Neurological:     General: No focal deficit present.     Mental Status: She is alert. Mental status is at baseline.     Comments: Cranial nerves II through XII intact, strength 5 out of 5 in the bilateral upper and lower extremities, no sensory deficit to light touch, no dysmetria on finger-nose-finger testing, ambulatory with steady gait.  Psychiatric:        Mood and Affect: Mood normal.        Behavior: Behavior normal.     ED Results and Treatments Labs (all labs ordered are listed, but only abnormal results are displayed) Labs Reviewed  COMPREHENSIVE METABOLIC PANEL - Abnormal; Notable for the following components:      Result Value   Calcium 8.7 (*)    Alkaline Phosphatase 36 (*)    Total Bilirubin 3.6 (*)    All other components within normal limits  CBC - Abnormal; Notable for the following components:   WBC 10.8 (*)    RBC 2.97 (*)    Hemoglobin 9.8 (*)    HCT 27.3 (*)    RDW 17.7 (*)    All  other components within normal limits                                                                                                                           Radiology No results found.  Pertinent labs & imaging results that were available during my care of the patient were reviewed by me and considered in my medical decision making (see MDM for details).  Medications Ordered in ED Medications  ketorolac (TORADOL) 15 MG/ML injection 15 mg (15 mg Intravenous Patient Refused/Not Given 05/25/23 2244)  prochlorperazine (COMPAZINE) injection 10 mg (10 mg Intravenous Patient Refused/Not Given 05/25/23 2245)  diphenhydrAMINE (BENADRYL) injection 12.5 mg (12.5 mg Intravenous Patient Refused/Not Given 05/25/23 2244)  lactated ringers bolus 1,000 mL (1,000 mLs Intravenous Patient Refused/Not Given 05/25/23 2245)                                                                                                                                     Procedures Procedures  (including critical care time)  Medical Decision Making / ED Course   MDM:  22 year old female presenting with headache.  Headache seems consistent with prior migraine headaches.  Her neurologic exam is normal.  Extremely low concern for dangerous cause of headache such as intracranial bleeding, tumor, meningitis or encephalitis, carbon monoxide poisoning, or other acute process.  Offered to provide migraine cocktail but patient reports that her symptoms are so mild she does not want any treatment.  She reports that she just wanted to be evaluated. Discussed self care for migraines. Advised PMD follow up.  Will discharge patient to home. All questions answered. Patient comfortable with plan of discharge. Return precautions discussed with patient and specified on the after visit summary.       Additional history obtained: -Additional history obtained from family  Lab Tests: -I ordered, reviewed, and interpreted labs.   The pertinent results include:   Labs Reviewed  COMPREHENSIVE  METABOLIC PANEL - Abnormal; Notable for the following components:      Result Value   Calcium 8.7 (*)    Alkaline Phosphatase 36 (*)    Total Bilirubin 3.6 (*)    All other components within normal limits  CBC - Abnormal; Notable for the following components:   WBC 10.8 (*)    RBC 2.97 (*)    Hemoglobin 9.8 (*)    HCT 27.3 (*)  RDW 17.7 (*)    All other components within normal limits    Notable for non specific mild anemia, leukocytosis. Non specific elevated bilirubin without abdominal pain or  tenderness.   E  Medicines ordered and prescription drug management: Meds ordered this encounter  Medications   ketorolac (TORADOL) 15 MG/ML injection 15 mg   prochlorperazine (COMPAZINE) injection 10 mg   diphenhydrAMINE (BENADRYL) injection 12.5 mg   lactated ringers bolus 1,000 mL   ibuprofen (ADVIL) 600 MG tablet    Sig: Take 1 tablet (600 mg total) by mouth every 6 (six) hours as needed.    Dispense:  30 tablet    Refill:  0    -I have reviewed the patients home medicines and have made adjustments as needed   Social Determinants of Health:  Diagnosis or treatment significantly limited by social determinants of health: obesity   Co morbidities that complicate the patient evaluation  Past Medical History:  Diagnosis Date   Abdominal pain, recurrent    Knee crepitus       Dispostion: Disposition decision including need for hospitalization was considered, and patient discharged from emergency department.    Final Clinical Impression(s) / ED Diagnoses Final diagnoses:  Other migraine without status migrainosus, not intractable     This chart was dictated using voice recognition software.  Despite best efforts to proofread,  errors can occur which can change the documentation meaning.    Lonell Grandchild, MD 05/25/23 [phone]

## 2023-05-25 NOTE — ED Notes (Signed)
Patient refused discharge vital signs. 

## 2023-05-25 NOTE — ED Notes (Addendum)
Patient sitting on stretcher talking on cell phone, mother at bedside. No acute distress noted at this time. Updated patient and mother that MD aware of her request and would speak with her soon.

## 2023-05-25 NOTE — ED Notes (Signed)
Patient reports headache x couple days, reports taking tylenol 2 days ago with no relief. Patient also reports that she has diarrhea when on menstrual cycle.

## 2023-07-29 IMAGING — US US MFM FETAL BPP W/O NON-STRESS
1 series · 15 of 26 positions shown · non-contrast
Comparison: none

[Series 1: us mfm fetal bpp w/o non-stress · 26 acquisitions, 15 frames shown]
[im 1/26]
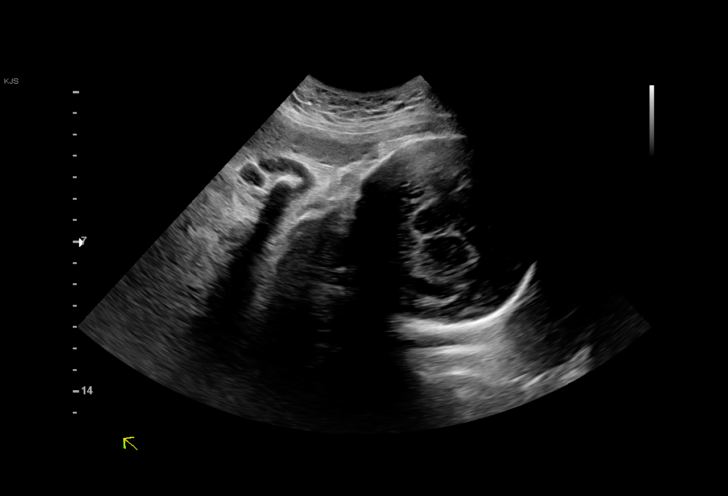
[im 3/26]
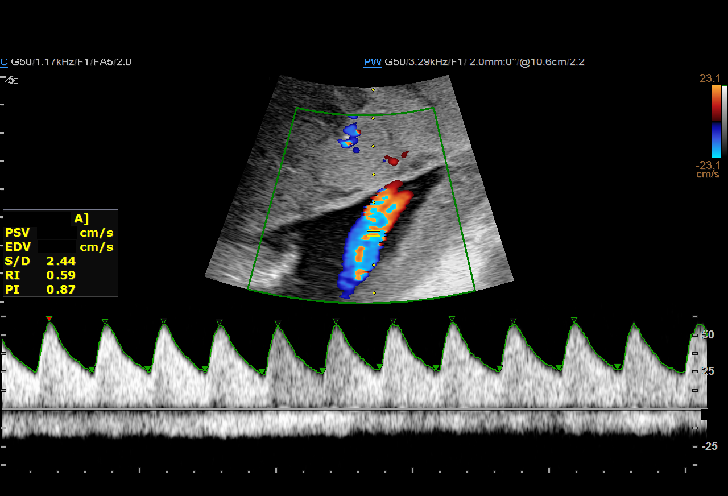
[im 5/26]
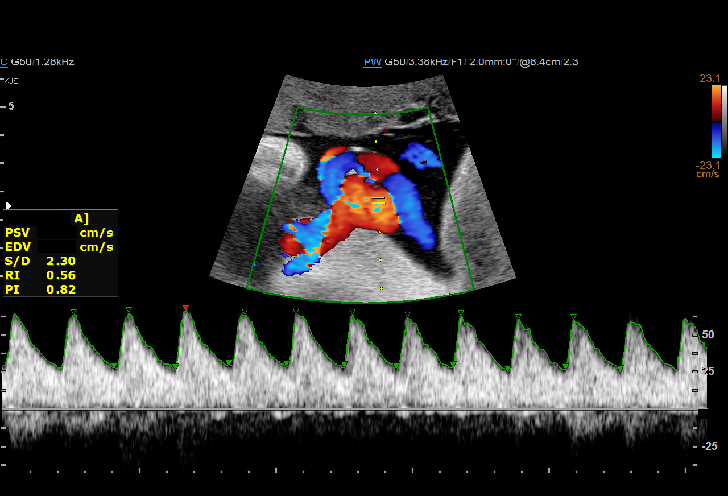
[im 7/26]
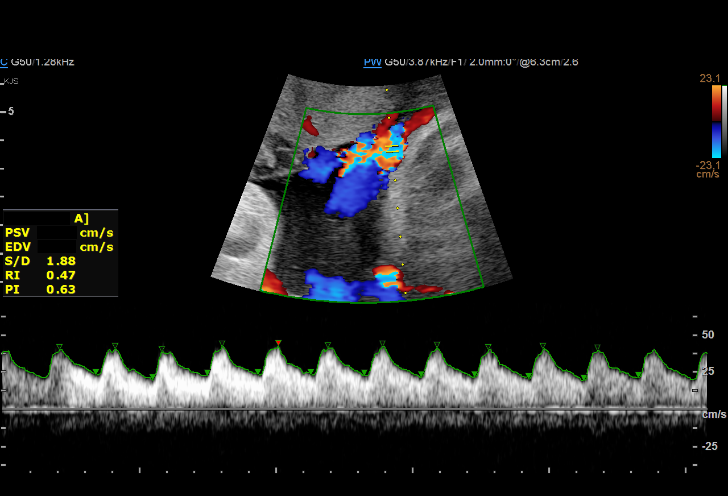
[im 8/26]
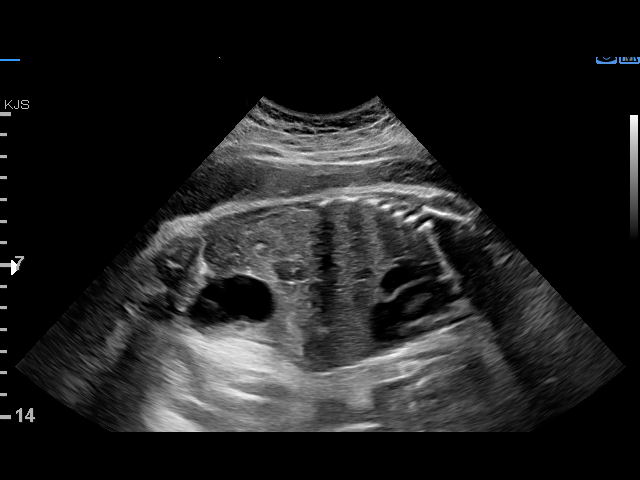
[im 10/26]
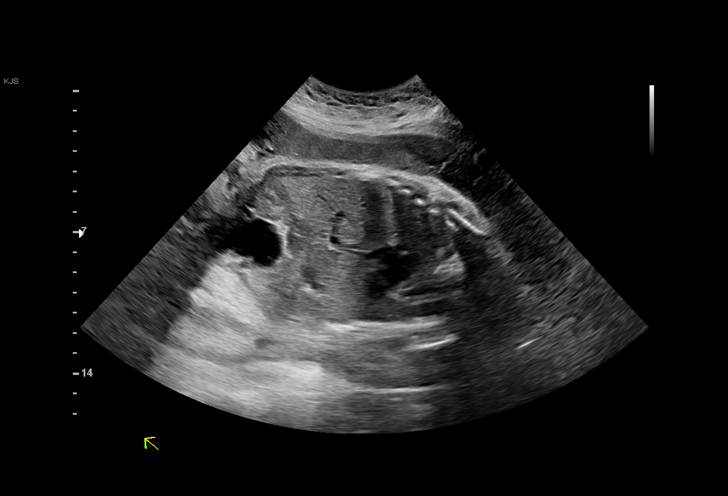
[im 12/26]
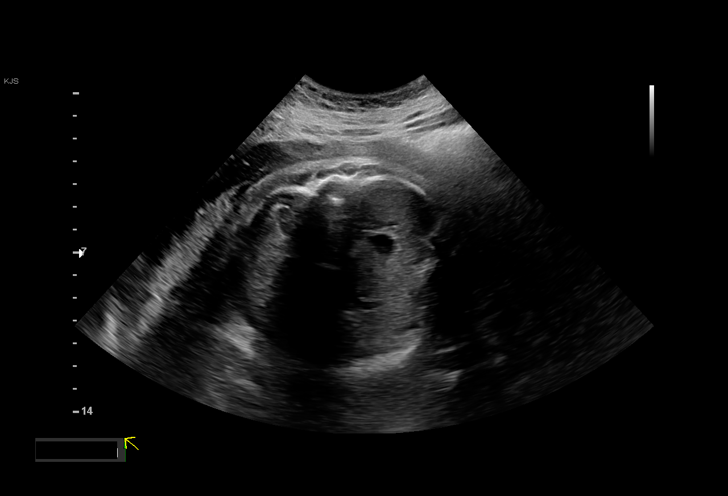
[im 14/26]
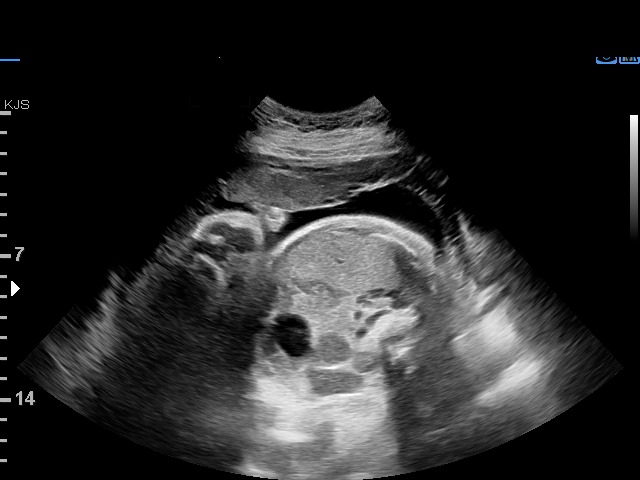
[im 15/26]
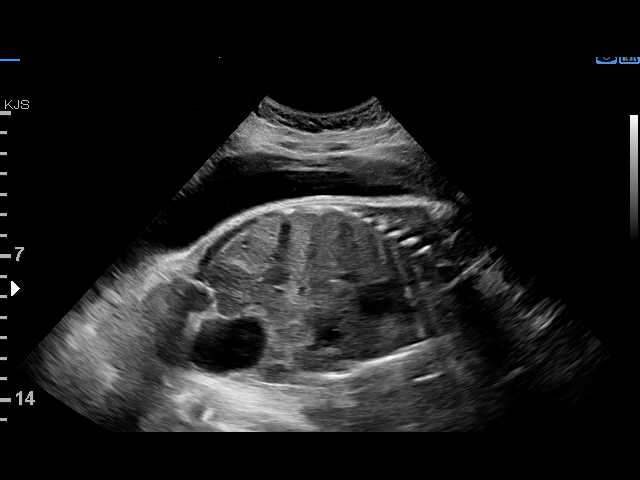
[im 17/26]
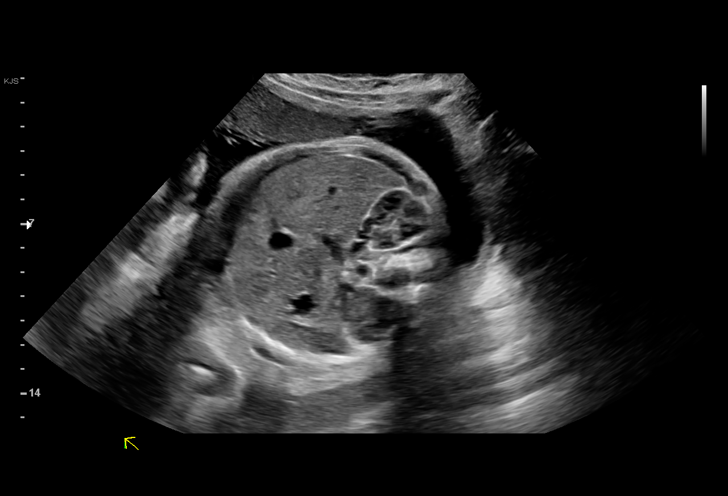
[im 19/26]
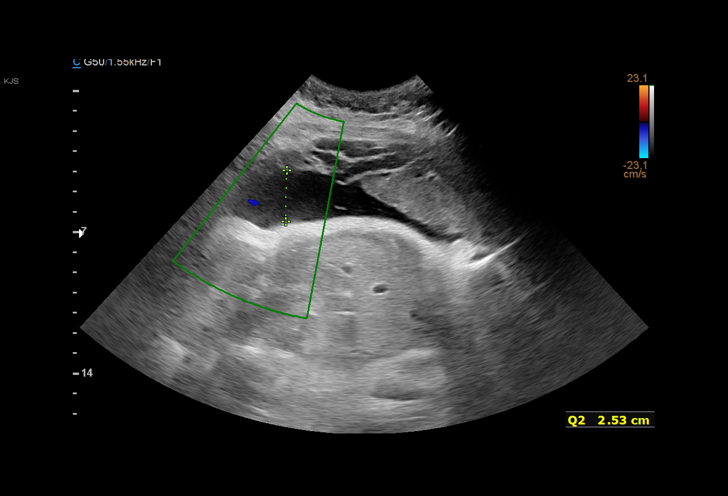
[im 20/26]
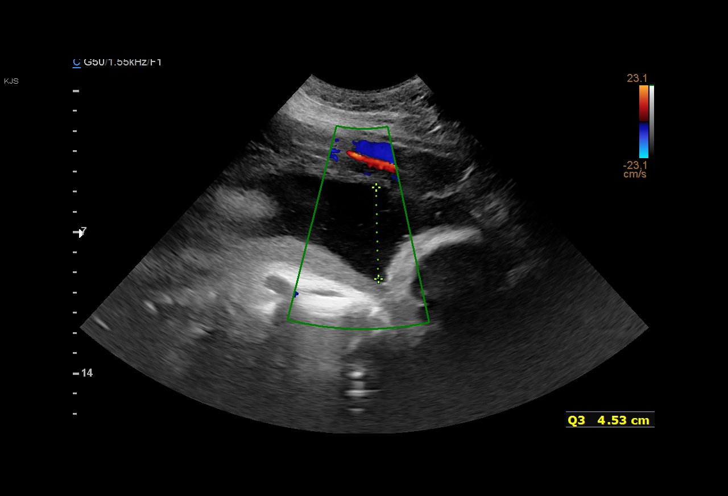
[im 22/26]
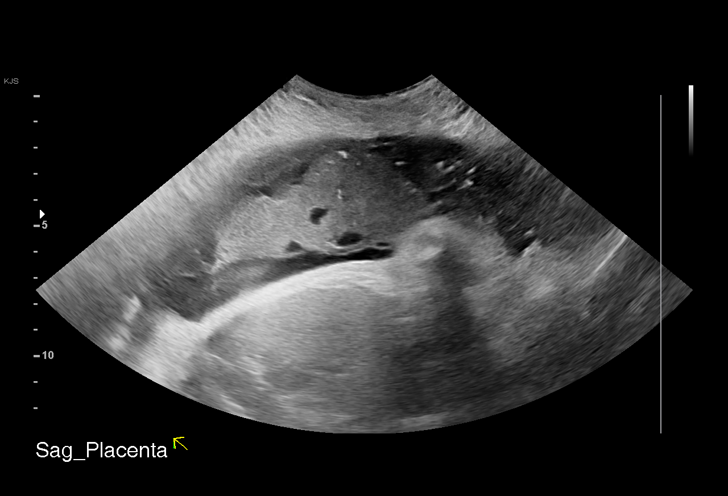
[im 24/26]
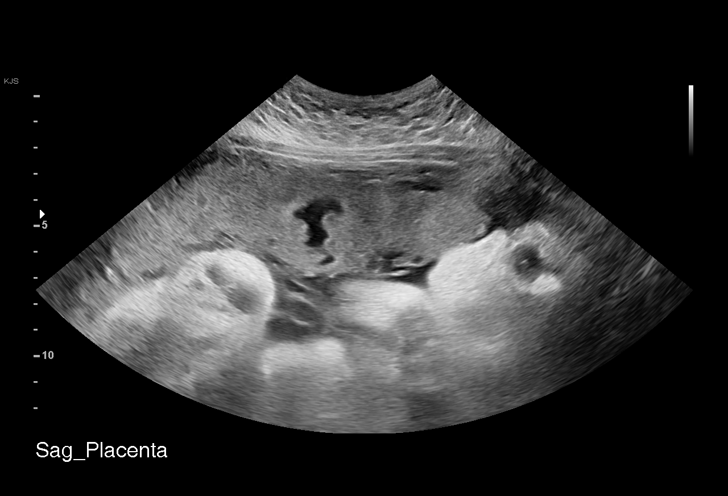
[im 26/26]
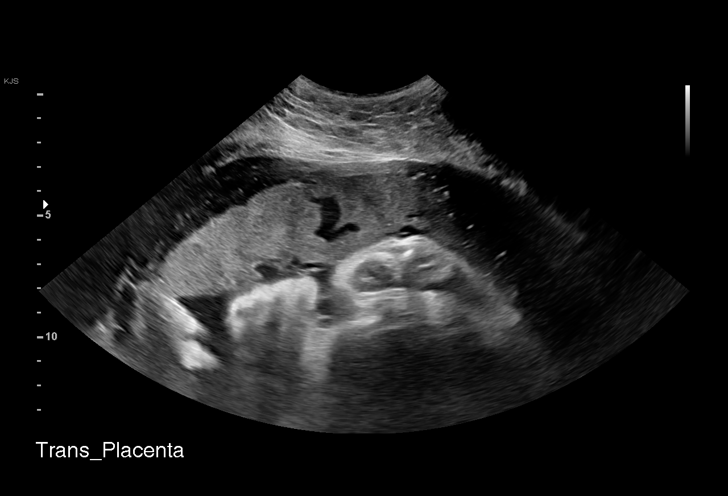

[15 of 26 positions shown; findings below may reference images not displayed]

Indications

 Maternal care for known or suspected poor
 fetal growth, third trimester, not applicable or
 unspecified IUGR
 Thyroid disease in pregnancy (Subclinical      O99.280,
 hyperthyroid)
 Obesity complicating pregnancy, third
 trimester (pregravid BMI 35)
 Asthma                                         IRR.FR j30.616
 Anemia during pregnancy in third trimester
 Medical complication of pregnancy (Elevated
 Bilirubin)
 Tobacco use complicating pregnancy, third
 trimester (e-cig)
 38 weeks gestation of pregnancy
 LR NIPS/ Negative AFP/ Negative Horizon
Fetal Evaluation

 Num Of Fetuses:         1
 Fetal Heart Rate(bpm):  144
 Cardiac Activity:       Observed
 Presentation:           Cephalic
 Placenta:               Anterior
 P. Cord Insertion:      Previously Visualized
 Amniotic Fluid
 AFI FV:      Within normal limits

 AFI Sum(cm)     %Tile       Largest Pocket(cm)
 15.57           61

 RUQ(cm)       RLQ(cm)       LUQ(cm)        LLQ(cm)

Biophysical Evaluation

 Amniotic F.V:   Pocket => 2 cm             F. Tone:        Observed
 F. Movement:    Observed                   Score:          [DATE]
 F. Breathing:   Observed
OB History

 Blood Type:   O+
 Gravidity:    1
Gestational Age

 LMP:           38w 2d        Date:  02/10/21                 EDD:   11/17/21
 Best:          38w 2d     Det. By:  LMP  (02/10/21)          EDD:   11/17/21
Doppler - Fetal Vessels

 Umbilical Artery
  S/D     %tile      RI    %tile      PI    %tile            ADFV    RDFV
   2.1       37    0.52       37    0.74       41               No      No

Comments

 This patient was seen due to a borderline IUGR fetus.  She
 denies any problems since her last exam and reports feeling
 vigorous fetal movements throughout the day.  Even done
 with tentative twins at
 A biophysical profile performed today was [DATE].
 There was normal amniotic fluid noted on today's ultrasound
 exam.
 Doppler studies of the umbilical arteries performed due to
 fetal growth restriction showed a normal S/D ratio of 2.1.
 There were no signs of absent or reversed end-diastolic flow
 noted today.
 Due to borderline IUGR, she already has an induction of
 labor scheduled on November 10, 2021.

## 2023-08-26 ENCOUNTER — Ambulatory Visit
Admission: RE | Admit: 2023-08-26 | Discharge: 2023-08-26 | Disposition: A | Discharge status: Home / Self Care | Payer: Medicaid Other | Source: Ambulatory Visit | Attending: Internal Medicine | Admitting: Internal Medicine

## 2023-08-26 VITALS — BP 113/73 | HR 91 | Temp 99.5°F | Resp 18

## 2023-08-26 DIAGNOSIS — Z113 Encounter for screening for infections with a predominantly sexual mode of transmission: Secondary | ICD-10-CM | POA: Diagnosis present

## 2023-08-26 DIAGNOSIS — Z7251 High risk heterosexual behavior: Secondary | ICD-10-CM | POA: Insufficient documentation

## 2023-08-26 LAB — POCT URINE PREGNANCY: Preg Test, Ur: NEGATIVE

## 2023-08-26 NOTE — ED Triage Notes (Signed)
Pt requesting routine STD testing with blood work. Denies sx's or exposures.

## 2023-08-26 NOTE — Discharge Instructions (Signed)
We will treat you for any infection(s) you test positive for. The blood work tests for HIV and syphilis. The vaginal sample will test for gonorrhea, chlamydia and trichomonas. If you have not heard from [place] by noon tomorrow, feel free to reach out to [place] directly and we will do a thorough review.

## 2023-08-26 NOTE — ED Provider Notes (Signed)
[facility]  Note:  This document was prepared using Conservation officer, historic buildings and may include unintentional dictation errors.  MRN: [id number] DOB: 12-Jan-2001  Subjective:   Kari Neal is a 22 y.o. female presenting for standard STI check. Would like bloodwork as well. Has unprotected sex with female partner. Denies fever, n/v, abdominal pain, pelvic pain, rashes, dysuria, urinary frequency, hematuria, vaginal discharge.  Would like a pregnancy test. LMP was 08/05/2023.  No current facility-administered medications for this encounter.  Current Outpatient Medications:    acetaminophen (TYLENOL) 325 MG tablet, Take 2 tablets (650 mg total) by mouth every 4 (four) hours as needed (for pain scale < 4)., Disp: 60 tablet, Rfl: 0   albuterol (VENTOLIN HFA) 108 (90 Base) MCG/ACT inhaler, Inhale 1-2 puffs into the lungs every 6 (six) hours as needed for wheezing or shortness of breath., Disp: 1 each, Rfl: 0   Blood Pressure Monitoring (BLOOD PRESSURE KIT) DEVI, 1 kit by Does not apply route once a week. Check Blood Pressure regularly and record readings into the Babyscripts App.  Large Cuff.  DX O90.0 (Patient taking differently: 1 kit by Does not apply route once a week. Check Blood Pressure regularly and record readings into the Babyscripts App.  Large Cuff.  DX O90.0), Disp: 1 each, Rfl: 0   ibuprofen (ADVIL) 600 MG tablet, Take 1 tablet (600 mg total) by mouth every 6 (six) hours as needed., Disp: 30 tablet, Rfl: 0   norelgestromin-ethinyl estradiol Burr Medico) 150-35 MCG/24HR transdermal patch, Starting the Sunday after a normal period, apply patch to skin as directed once a week for 3 weeks, then leave off for 1 week.each month for menstruation., Disp: 3 patch, Rfl: 12   Prenatal-DSS-FeCb-FeGl-FA (CITRANATAL BLOOM) 90-1 MG TABS, Take 1 tablet by mouth daily before breakfast., Disp: 90 tablet, Rfl: 4   Allergies  Allergen Reactions   Shellfish Allergy Hives    Past  Medical History:  Diagnosis Date   Abdominal pain, recurrent    Knee crepitus      Past Surgical History:  Procedure Laterality Date   CHOLECYSTECTOMY N/A 01/13/2019   Procedure: LAPAROSCOPIC CHOLECYSTECTOMY WITH INTRAOPERATIVE CHOLANGIOGRAM ERAS PATHWAY;  Surgeon: Ovidio Kin, MD;  Location: [facility] OR;  Service: General;  Laterality: N/A;    Family History  Problem Relation Age of Onset   Anxiety disorder Mother    Miscarriages / Stillbirths Mother    Diabetes Paternal Aunt    Cancer Maternal Grandmother    Breast cancer Maternal Grandmother    Hypertension Maternal Grandmother    Miscarriages / Stillbirths Maternal Grandmother    Diabetes Maternal Grandfather    Arthritis Paternal Grandmother     Social History   Tobacco Use   Smoking status: Former    Types: E-cigarettes    Start date: 04/26/2019    Passive exposure: Current   Smokeless tobacco: Never  Vaping Use   Vaping status: Never Used  Substance Use Topics   Alcohol use: Not Currently   Drug use: No    ROS   Objective:   Vitals: BP 113/73 (BP Location: Right Arm)   Pulse 91   Temp 99.5 F (37.5 C) (Oral)   Resp 18   LMP 07/05/2023   SpO2 99%   Breastfeeding No   Physical Exam Constitutional:      General: She is not in acute distress.    Appearance: Normal appearance. She is well-developed. She is not ill-appearing, toxic-appearing or diaphoretic.  HENT:  Head: Normocephalic and atraumatic.     Nose: Nose normal.     Mouth/Throat:     Mouth: Mucous membranes are moist.  Eyes:     General: No scleral icterus.       Right eye: No discharge.        Left eye: No discharge.     Extraocular Movements: Extraocular movements intact.  Cardiovascular:     Rate and Rhythm: Normal rate.  Pulmonary:     Effort: Pulmonary effort is normal.  Skin:    General: Skin is warm and dry.  Neurological:     General: No focal deficit present.     Mental Status: She is alert and oriented to person, place,  and time.  Psychiatric:        Mood and Affect: Mood normal.        Behavior: Behavior normal.     Results for orders placed or performed during the hospital encounter of 08/26/23 (from the past 24 hour(s))  POCT urine pregnancy     Status: None   Collection Time: 08/26/23  4:19 PM  Result Value Ref Range   Preg Test, Ur Negative Negative    Assessment and Plan :   PDMP not reviewed this encounter.  1. Screen for STD (sexually transmitted disease)   2. Unprotected sex    Will treat for any positive results. Labs pending.    Wallis Bamberg, [place] 08/26/23 1621

## 2023-08-27 LAB — RPR: RPR Ser Ql: NONREACTIVE

## 2023-08-27 LAB — HIV ANTIBODY (ROUTINE TESTING W REFLEX): HIV Screen 4th Generation wRfx: NONREACTIVE

## 2023-08-31 LAB — CERVICOVAGINAL ANCILLARY ONLY
Chlamydia: NEGATIVE
Comment: NEGATIVE
Comment: NEGATIVE
Comment: NORMAL
Neisseria Gonorrhea: NEGATIVE
Trichomonas: NEGATIVE

## 2023-09-28 ENCOUNTER — Ambulatory Visit
Admission: EM | Admit: 2023-09-28 | Discharge: 2023-09-28 | Disposition: A | Discharge status: Home / Self Care | Payer: Medicaid Other | Attending: Internal Medicine | Admitting: Internal Medicine

## 2023-09-28 DIAGNOSIS — B9789 Other viral agents as the cause of diseases classified elsewhere: Secondary | ICD-10-CM | POA: Insufficient documentation

## 2023-09-28 DIAGNOSIS — Z1152 Encounter for screening for COVID-19: Secondary | ICD-10-CM | POA: Diagnosis not present

## 2023-09-28 DIAGNOSIS — J4530 Mild persistent asthma, uncomplicated: Secondary | ICD-10-CM | POA: Diagnosis not present

## 2023-09-28 DIAGNOSIS — J988 Other specified respiratory disorders: Secondary | ICD-10-CM | POA: Diagnosis present

## 2023-09-28 MED ORDER — PROMETHAZINE-DM 6.25-15 MG/5ML PO SYRP: 5.0000 mL | ORAL_SOLUTION | Freq: Three times a day (TID) | ORAL | 0 refills | Status: AC | PRN

## 2023-09-28 MED ORDER — CETIRIZINE HCL 10 MG PO TABS: 10.0000 mg | ORAL_TABLET | Freq: Every day | ORAL | 0 refills | Status: AC

## 2023-09-28 MED ORDER — PREDNISONE 20 MG PO TABS: ORAL_TABLET | ORAL | 0 refills | Status: AC

## 2023-09-28 MED ORDER — ALBUTEROL SULFATE HFA 108 (90 BASE) MCG/ACT IN AERS: 1.0000 | INHALATION_SPRAY | Freq: Four times a day (QID) | RESPIRATORY_TRACT | 0 refills | Status: AC | PRN

## 2023-09-28 NOTE — ED Provider Notes (Signed)
[facility]  Note:  This document was prepared using Conservation officer, historic buildings and may include unintentional dictation errors.  MRN: [id number] DOB: 12/05/2000  Subjective:   Kari Neal is a 22 y.o. female presenting for 1 day history of sneezing, sinus congestion, chest congestion, chest tightness, productive cough.  Has a history of asthma.  Needs a refill on her inhaler.  Would like a COVID test.  Patient is a smoker, uses marijuana and vapes daily.  No current facility-administered medications for this encounter.  Current Outpatient Medications:    acetaminophen (TYLENOL) 325 MG tablet, Take 2 tablets (650 mg total) by mouth every 4 (four) hours as needed (for pain scale < 4)., Disp: 60 tablet, Rfl: 0   albuterol (VENTOLIN HFA) 108 (90 Base) MCG/ACT inhaler, Inhale 1-2 puffs into the lungs every 6 (six) hours as needed for wheezing or shortness of breath., Disp: 1 each, Rfl: 0   Blood Pressure Monitoring (BLOOD PRESSURE KIT) DEVI, 1 kit by Does not apply route once a week. Check Blood Pressure regularly and record readings into the Babyscripts App.  Large Cuff.  DX O90.0 (Patient taking differently: 1 kit by Does not apply route once a week. Check Blood Pressure regularly and record readings into the Babyscripts App.  Large Cuff.  DX O90.0), Disp: 1 each, Rfl: 0   ibuprofen (ADVIL) 600 MG tablet, Take 1 tablet (600 mg total) by mouth every 6 (six) hours as needed., Disp: 30 tablet, Rfl: 0   norelgestromin-ethinyl estradiol Burr Medico) 150-35 MCG/24HR transdermal patch, Starting the Sunday after a normal period, apply patch to skin as directed once a week for 3 weeks, then leave off for 1 week.each month for menstruation. (Patient not taking: Reported on 08/26/2023), Disp: 3 patch, Rfl: 12   Allergies  Allergen Reactions   Shellfish Allergy Hives    Past Medical History:  Diagnosis Date   Abdominal pain, recurrent    Knee crepitus      Past Surgical  History:  Procedure Laterality Date   CHOLECYSTECTOMY N/A 01/13/2019   Procedure: LAPAROSCOPIC CHOLECYSTECTOMY WITH INTRAOPERATIVE CHOLANGIOGRAM ERAS PATHWAY;  Surgeon: Ovidio Kin, MD;  Location: [facility] OR;  Service: General;  Laterality: N/A;    Family History  Problem Relation Age of Onset   Anxiety disorder Mother    Miscarriages / Stillbirths Mother    Diabetes Paternal Aunt    Cancer Maternal Grandmother    Breast cancer Maternal Grandmother    Hypertension Maternal Grandmother    Miscarriages / Stillbirths Maternal Grandmother    Diabetes Maternal Grandfather    Arthritis Paternal Grandmother     Social History   Tobacco Use   Smoking status: Every Day    Types: E-cigarettes    Start date: 04/26/2019    Passive exposure: Current   Smokeless tobacco: Never  Vaping Use   Vaping status: Every Day  Substance Use Topics   Alcohol use: Yes    Comment: occ   Drug use: Yes    Types: Marijuana    ROS   Objective:   Vitals: BP 103/66 (BP Location: Right Arm)   Pulse (!) 105   Temp 99.1 F (37.3 C)   Resp 20   LMP 09/05/2023   SpO2 98%   Physical Exam Constitutional:      General: She is not in acute distress.    Appearance: Normal appearance. She is well-developed. She is not ill-appearing, toxic-appearing or diaphoretic.  HENT:     Head: Normocephalic and  atraumatic.     Right Ear: External ear normal.     Left Ear: External ear normal.     Nose: Congestion and rhinorrhea present.     Mouth/Throat:     Mouth: Mucous membranes are moist.  Eyes:     General: No scleral icterus.       Right eye: No discharge.        Left eye: No discharge.     Extraocular Movements: Extraocular movements intact.  Cardiovascular:     Rate and Rhythm: Normal rate and regular rhythm.     Heart sounds: Normal heart sounds. No murmur heard.    No friction rub. No gallop.  Pulmonary:     Effort: Pulmonary effort is normal. No respiratory distress.     Breath sounds: No stridor.  No wheezing, rhonchi or rales.  Chest:     Chest wall: No tenderness.  Skin:    General: Skin is warm and dry.  Neurological:     General: No focal deficit present.     Mental Status: She is alert and oriented to person, place, and time.  Psychiatric:        Mood and Affect: Mood normal.        Behavior: Behavior normal.     Assessment and Plan :   PDMP not reviewed this encounter.  1. Viral respiratory infection   2. Mild persistent asthma without complication    Will defer imaging for now.  Recommend an oral prednisone course.  Refilled her albuterol inhaler.  Will manage for viral illness such as viral URI, viral syndrome, viral rhinitis, COVID-19. Recommended supportive care. Offered scripts for symptomatic relief. Testing is pending. Counseled patient on potential for adverse effects with medications prescribed/recommended today, ER and return-to-clinic precautions discussed, patient verbalized understanding.     Wallis Bamberg, [place] 09/28/23 1651

## 2023-09-28 NOTE — ED Triage Notes (Signed)
Pt c/o cough, sneezing, head/chest congestion started last night-denies fever-c/o intermittent CP that started ~3am-NAD-steady gait

## 2023-09-28 NOTE — Discharge Instructions (Signed)
We will notify you of your test results as they arrive and may take between about 24 hours.  I encourage you to sign up for MyChart if you have not already done so as this can be the easiest way for [place] to communicate results to you online or through a phone app.  Generally, we only contact you if it is a positive test result.  In the meantime, if you develop worsening symptoms including fever, chest pain, shortness of breath despite our current treatment plan then please report to the emergency room as this may be a sign of worsening status from possible viral infection.  Otherwise, we will manage this as a viral respiratory infection. For sore throat or cough try using a honey-based tea. Use 3 teaspoons of honey with juice squeezed from half lemon. Place shaved pieces of ginger into 1/2-1 cup of water and warm over stove top. Then mix the ingredients and repeat every 4 hours as needed. Please take Tylenol 500mg -650mg  every 6 hours for aches and pains, fevers. Hydrate very well with at least 2 liters of water. Eat light meals such as soups to replenish electrolytes and soft fruits, veggies. Start an antihistamine like Zyrtec (10mg  daily) for postnasal drainage, sinus congestion.  You can take this together with prednisone and albuterol.  Use the cough medications as needed.

## 2023-09-29 LAB — SARS CORONAVIRUS 2 (TAT 6-24 HRS): SARS Coronavirus 2: NEGATIVE

## 2024-06-22 ENCOUNTER — Ambulatory Visit
Admission: RE | Admit: 2024-06-22 | Discharge: 2024-06-22 | Disposition: A | Discharge status: Home / Self Care | Payer: Self-pay | Source: Ambulatory Visit | Attending: Family Medicine | Admitting: Family Medicine

## 2024-06-22 ENCOUNTER — Other Ambulatory Visit: Payer: Self-pay

## 2024-06-22 VITALS — BP 124/60 | HR 103 | Temp 98.4°F

## 2024-06-22 DIAGNOSIS — Z113 Encounter for screening for infections with a predominantly sexual mode of transmission: Secondary | ICD-10-CM | POA: Insufficient documentation

## 2024-06-22 NOTE — ED Provider Notes (Signed)
 [facility]    CSN: [id number] Arrival date & time: 06/22/24  1057      History   Chief Complaint Chief Complaint  Patient presents with   SEXUALLY TRANSMITTED DISEASE    I would like to get tested not for a specific reason just because I haven't been in a while. - Entered by patient    HPI Kari Neal is a 23 y.o. female presents for STD screening.  She denies any symptoms including vaginal discharge, dysuria, nausea/vomiting fevers or flank pain.  No known exposure.  No other concerns at this time  HPI  Past Medical History:  Diagnosis Date   Abdominal pain, recurrent    Knee crepitus     Patient Active Problem List   Diagnosis Date Noted   History of cholecystectomy 11/11/2021   Anemia affecting pregnancy, antepartum 05/24/2021   Low thyroid stimulating hormone (TSH) level 05/24/2021   BMI 35.0-35.9,adult 05/24/2021   Asthma 04/26/2021   Supervision of high-risk pregnancy, third trimester 04/16/2021   Serum total bilirubin elevated 04/13/2021   Abdominal pain, recurrent     Past Surgical History:  Procedure Laterality Date   CHOLECYSTECTOMY N/A 01/13/2019   Procedure: LAPAROSCOPIC CHOLECYSTECTOMY WITH INTRAOPERATIVE CHOLANGIOGRAM ERAS PATHWAY;  Surgeon: Ethyl Lenis, MD;  Location: [facility] OR;  Service: General;  Laterality: N/A;    OB History     Gravida  1   Para  1   Term  1   Preterm      AB      Living  1      SAB      IAB      Ectopic      Multiple  0   Live Births  1            Home Medications    Prior to Admission medications   Medication Sig Start Date End Date Taking? Authorizing Provider  acetaminophen  (TYLENOL ) 325 MG tablet Take 2 tablets (650 mg total) by mouth every 4 (four) hours as needed (for pain scale < 4). 11/14/21   Virgilio Payor, MD  albuterol  (VENTOLIN  HFA) 108 [phone] Base) MCG/ACT inhaler Inhale 1-2 puffs into the lungs every 6 (six) hours as needed for wheezing or shortness of breath. 09/28/23   Christopher Savannah, PA-C  Blood Pressure Monitoring (BLOOD PRESSURE KIT) DEVI 1 kit by Does not apply route once a week. Check Blood Pressure regularly and record readings into the Babyscripts App.  Large Cuff.  DX O90.0 Patient taking differently: 1 kit by Does not apply route once a week. Check Blood Pressure regularly and record readings into the Babyscripts App.  Large Cuff.  DX O90.0 08/16/21   Rudy Carlin LABOR, MD  cetirizine  (ZYRTEC  ALLERGY) 10 MG tablet Take 1 tablet (10 mg total) by mouth daily. 09/28/23   Christopher Savannah, PA-C  ibuprofen  (ADVIL ) 600 MG tablet Take 1 tablet (600 mg total) by mouth every 6 (six) hours as needed. 05/25/23   Francesca Elsie CROME, MD  norelgestromin-ethinyl estradiol (XULANE ) 150-35 MCG/24HR transdermal patch Starting the Sunday after a normal period, apply patch to skin as directed once a week for 3 weeks, then leave off for 1 week.each month for menstruation. Patient not taking: Reported on 08/26/2023 02/11/22   Rudy Carlin LABOR, MD  predniSONE  (DELTASONE ) 20 MG tablet Take 2 tablets daily with breakfast. 09/28/23   Christopher Savannah, PA-C  promethazine -dextromethorphan (PROMETHAZINE -DM) 6.25-15 MG/5ML syrup Take 5 mLs by mouth 3 (three) times daily as needed for  cough. Patient not taking: Reported on 06/22/2024 09/28/23   Christopher Savannah, PA-C    Family History Family History  Problem Relation Age of Onset   Anxiety disorder Mother    Miscarriages / [place] Mother    Diabetes Paternal Aunt    Cancer Maternal Grandmother    Breast cancer Maternal Grandmother    Hypertension Maternal Grandmother    Miscarriages / Stillbirths Maternal Grandmother    Diabetes Maternal Grandfather    Arthritis Paternal Grandmother     Social History Social History   Tobacco Use   Smoking status: Every Day    Types: E-cigarettes    Start date: 04/26/2019    Passive exposure: Current   Smokeless tobacco: Never  Vaping Use   Vaping status: Every Day  Substance Use Topics   Alcohol use: Yes     Comment: occ   Drug use: Yes    Types: Marijuana     Allergies   Shellfish allergy   Review of Systems Review of Systems  Genitourinary:        STD screening     Physical Exam Triage Vital Signs ED Triage Vitals  Encounter Vitals Group     BP 06/22/24 1115 124/60     Girls Systolic BP Percentile --      Girls Diastolic BP Percentile --      Boys Systolic BP Percentile --      Boys Diastolic BP Percentile --      Pulse Rate 06/22/24 1115 (!) 103     Resp --      Temp 06/22/24 1125 98.4 F (36.9 C)     Temp Source 06/22/24 1115 Oral     SpO2 06/22/24 1115 98 %     Weight --      Height --      Head Circumference --      Peak Flow --      Pain Score 06/22/24 1125 0     Pain Loc --      Pain Education --      Exclude from Growth Chart --    No data found.  Updated Vital Signs BP 124/60 (BP Location: Left Arm)   Pulse (!) 103   Temp 98.4 F (36.9 C)   SpO2 98%   Visual Acuity Right Eye Distance:   Left Eye Distance:   Bilateral Distance:    Right Eye Near:   Left Eye Near:    Bilateral Near:     Physical Exam Vitals and nursing note reviewed.  Constitutional:      Appearance: Normal appearance.  HENT:     Head: Normocephalic and atraumatic.  Eyes:     Pupils: Pupils are equal, round, and reactive to light.  Cardiovascular:     Rate and Rhythm: Normal rate.  Pulmonary:     Effort: Pulmonary effort is normal.  Skin:    General: Skin is warm and dry.  Neurological:     General: No focal deficit present.     Mental Status: She is alert and oriented to person, place, and time.  Psychiatric:        Mood and Affect: Mood normal.        Behavior: Behavior normal.      UC Treatments / Results  Labs (all labs ordered are listed, but only abnormal results are displayed) Labs Reviewed  RPR  HIV ANTIBODY (ROUTINE TESTING W REFLEX)  CERVICOVAGINAL ANCILLARY ONLY    EKG   Radiology No results found.  Procedures Procedures (including  critical care time)  Medications Ordered in UC Medications - No data to display  Initial Impression / Assessment and Plan / UC Course  I have reviewed the triage vital signs and the nursing notes.  Pertinent labs & imaging results that were available during my care of the patient were reviewed by me and considered in my medical decision making (see chart for details).     STD testing is ordered and will contact for any positive results.  Patient to follow-up as needed. Final Clinical Impressions(s) / UC Diagnoses   Final diagnoses:  Screening examination for STD (sexually transmitted disease)     Discharge Instructions      The clinical contact you with results of the testing done today if positive.  Follow-up as needed    ED Prescriptions   None    PDMP not reviewed this encounter.   Loreda Myla SAUNDERS, NP 06/22/24 [phone]

## 2024-06-22 NOTE — Discharge Instructions (Addendum)
 The clinical contact you with results of the testing done today if positive.  Follow-up as needed.

## 2024-06-22 NOTE — ED Triage Notes (Signed)
 Pt presents for STI testing for peace of mind. Is asymptomatic.

## 2024-06-23 LAB — HIV ANTIBODY (ROUTINE TESTING W REFLEX): HIV Screen 4th Generation wRfx: NONREACTIVE

## 2024-06-23 LAB — CERVICOVAGINAL ANCILLARY ONLY
Chlamydia: NEGATIVE
Comment: NEGATIVE
Comment: NEGATIVE
Comment: NORMAL
Neisseria Gonorrhea: NEGATIVE
Trichomonas: NEGATIVE

## 2024-06-23 LAB — RPR: RPR Ser Ql: NONREACTIVE

## 2024-12-16 ENCOUNTER — Ambulatory Visit
Admission: EM | Admit: 2024-12-16 | Discharge: 2024-12-16 | Disposition: A | Discharge status: Home / Self Care | Attending: Family Medicine | Admitting: Family Medicine

## 2024-12-16 DIAGNOSIS — K0510 Chronic gingivitis, plaque induced: Secondary | ICD-10-CM | POA: Diagnosis not present

## 2024-12-16 DIAGNOSIS — K0889 Other specified disorders of teeth and supporting structures: Secondary | ICD-10-CM | POA: Diagnosis not present

## 2024-12-16 MED ORDER — NAPROXEN 500 MG PO TABS: 500.0000 mg | ORAL_TABLET | Freq: Two times a day (BID) | ORAL | 0 refills | Status: AC | PRN

## 2024-12-16 MED ORDER — AMOXICILLIN-POT CLAVULANATE 875-125 MG PO TABS: 1.0000 | ORAL_TABLET | Freq: Two times a day (BID) | ORAL | 0 refills | Status: AC

## 2024-12-16 NOTE — ED Provider Notes (Signed)
 " [facility]    CSN: [id number] Arrival date & time: 12/16/24  1354      History   Chief Complaint Chief Complaint  Patient presents with   Dental Pain    HPI Kari Neal is a 24 y.o. female presents for dental pain.  Patient reports she has had a left upper wisdom tooth coming in.  States it occasionally hurts but this time over the past 3 days has been more persistent.  She states she feels like her face is slightly swollen and feels pain on the left side of her throat.  No fevers or chills.  She tried to get into her dentist today but they were closed.  She has been taking ibuprofen  for symptoms.  No other concerns.   Dental Pain   Past Medical History:  Diagnosis Date   Abdominal pain, recurrent    Knee crepitus     Patient Active Problem List   Diagnosis Date Noted   History of cholecystectomy 11/11/2021   Anemia affecting pregnancy, antepartum 05/24/2021   Low thyroid stimulating hormone (TSH) level 05/24/2021   BMI 35.0-35.9,adult 05/24/2021   Asthma 04/26/2021   Supervision of high-risk pregnancy, third trimester 04/16/2021   Serum total bilirubin elevated 04/13/2021   Abdominal pain, recurrent     Past Surgical History:  Procedure Laterality Date   CHOLECYSTECTOMY N/A 01/13/2019   Procedure: LAPAROSCOPIC CHOLECYSTECTOMY WITH INTRAOPERATIVE CHOLANGIOGRAM ERAS PATHWAY;  Surgeon: Ethyl Lenis, MD;  Location: [facility];  Service: General;  Laterality: N/A;    OB History     Gravida  1   Para  1   Term  1   Preterm      AB      Living  1      SAB      IAB      Ectopic      Multiple  0   Live Births  1            Home Medications    Prior to Admission medications  Medication Sig Start Date End Date Taking? Authorizing Provider  amoxicillin-clavulanate (AUGMENTIN) 875-125 MG tablet Take 1 tablet by mouth every 12 (twelve) hours. 12/16/24  Yes Shadoe Bethel, Jodi R, NP  naproxen (NAPROSYN) 500 MG tablet Take 1 tablet (500 mg total) by  mouth 2 (two) times daily as needed (dental pain). 12/16/24  Yes Donivan Thammavong, Jodi R, NP  acetaminophen  (TYLENOL ) 325 MG tablet Take 2 tablets (650 mg total) by mouth every 4 (four) hours as needed (for pain scale < 4). 11/14/21   Virgilio Payor, MD  albuterol  (VENTOLIN  HFA) 108 [phone] Base) MCG/ACT inhaler Inhale 1-2 puffs into the lungs every 6 (six) hours as needed for wheezing or shortness of breath. 09/28/23   Christopher Savannah, PA-C  Blood Pressure Monitoring (BLOOD PRESSURE KIT) DEVI 1 kit by Does not apply route once a week. Check Blood Pressure regularly and record readings into the [employer] App.  Large Cuff.  DX O90.0 Patient taking differently: 1 kit by Does not apply route once a week. Check Blood Pressure regularly and record readings into the [employer] App.  Large Cuff.  DX O90.0 08/16/21   Rudy Carlin LABOR, MD  cetirizine  (ZYRTEC  ALLERGY) 10 MG tablet Take 1 tablet (10 mg total) by mouth daily. 09/28/23   Christopher Savannah, PA-C  norelgestromin-ethinyl estradiol (XULANE ) 150-35 MCG/24HR transdermal patch Starting the Sunday after a normal period, apply patch to skin as directed once a week for 3 weeks, then leave  off for 1 week.each month for menstruation. Patient not taking: Reported on 08/26/2023 02/11/22   Rudy Carlin LABOR, MD  predniSONE  (DELTASONE ) 20 MG tablet Take 2 tablets daily with breakfast. 09/28/23   Christopher Savannah, PA-C  promethazine -dextromethorphan (PROMETHAZINE -DM) 6.25-15 MG/5ML syrup Take 5 mLs by mouth 3 (three) times daily as needed for cough. Patient not taking: Reported on 06/22/2024 09/28/23   Christopher Savannah, PA-C    Family History Family History  Problem Relation Age of Onset   Anxiety disorder Mother    Miscarriages / Stillbirths Mother    Diabetes Paternal Aunt    Cancer Maternal Grandmother    Breast cancer Maternal Grandmother    Hypertension Maternal Grandmother    Miscarriages / Stillbirths Maternal Grandmother    Diabetes Maternal Grandfather    Arthritis Paternal Grandmother      Social History Social History[1]   Allergies   Shellfish allergy   Review of Systems Review of Systems  HENT:  Positive for dental problem.      Physical Exam Triage Vital Signs ED Triage Vitals  Encounter Vitals Group     BP 12/16/24 1510 107/69     Girls Systolic BP Percentile --      Girls Diastolic BP Percentile --      Boys Systolic BP Percentile --      Boys Diastolic BP Percentile --      Pulse Rate 12/16/24 1509 81     Resp 12/16/24 1509 17     Temp 12/16/24 1509 97.9 F (36.6 C)     Temp src --      SpO2 12/16/24 1509 99 %     Weight --      Height --      Head Circumference --      Peak Flow --      Pain Score 12/16/24 1508 6     Pain Loc --      Pain Education --      Exclude from Growth Chart --    No data found.  Updated Vital Signs BP 107/69   Pulse 81   Temp 97.9 F (36.6 C)   Resp 17   LMP 12/15/2024 (Exact Date)   SpO2 99%   Visual Acuity Right Eye Distance:   Left Eye Distance:   Bilateral Distance:    Right Eye Near:   Left Eye Near:    Bilateral Near:     Physical Exam Vitals and nursing note reviewed.  Constitutional:      General: She is not in acute distress.    Appearance: Normal appearance. She is not ill-appearing.  HENT:     Head: Normocephalic and atraumatic.     Mouth/Throat:     Pharynx: Oropharynx is clear. Uvula midline. No posterior oropharyngeal erythema or uvula swelling.     Comments: There is a wisdom tooth to the left upper aspect of the mouth that is partially broken through.  Gumline is very inflamed but there is no palpable abscess.  No visible facial swelling. Eyes:     Pupils: Pupils are equal, round, and reactive to light.  Cardiovascular:     Rate and Rhythm: Normal rate.  Pulmonary:     Effort: Pulmonary effort is normal.  Skin:    General: Skin is warm and dry.  Neurological:     General: No focal deficit present.     Mental Status: She is alert and oriented to person, place, and time.   Psychiatric:  Mood and Affect: Mood normal.        Behavior: Behavior normal.      UC Treatments / Results  Labs (all labs ordered are listed, but only abnormal results are displayed) Labs Reviewed - No data to display  EKG   Radiology No results found.  Procedures Procedures (including critical care time)  Medications Ordered in UC Medications - No data to display  Initial Impression / Assessment and Plan / UC Course  I have reviewed the triage vital signs and the nursing notes.  Pertinent labs & imaging results that were available during my care of the patient were reviewed by me and considered in my medical decision making (see chart for details).     Reviewed exam and symptoms with patient.  Will start Augmentin.  Possible developing infection and to naproxen as needed for pain/inflammation.  Advised patient to reach out to dentist soon as possible for further evaluation and treatment.  ER precautions reviewed. Final Clinical Impressions(s) / UC Diagnoses   Final diagnoses:  Pain, dental  Gum inflammation     Discharge Instructions      Start Augmentin antibiotic twice daily for 7 days.  You also may take naproxen twice daily as needed for pain.  You may use over-the-counter Tylenol  if needed as well.  Salt water  gargles frequently.  Follow-up with your dentist as soon as possible for further evaluation and treatment.  Please go to the emergency room if you develop any worsening symptoms such as fever or facial swelling.  Hope you feel better soon!    ED Prescriptions     Medication Sig Dispense Auth. Provider   naproxen (NAPROSYN) 500 MG tablet Take 1 tablet (500 mg total) by mouth 2 (two) times daily as needed (dental pain). 14 tablet Cloyd Ragas, Jodi R, NP   amoxicillin-clavulanate (AUGMENTIN) 875-125 MG tablet Take 1 tablet by mouth every 12 (twelve) hours. 14 tablet Camille Dragan, Jodi R, NP      PDMP not reviewed this encounter.    [1]  Social  History Tobacco Use   Smoking status: Every Day    Types: E-cigarettes    Start date: 04/26/2019    Passive exposure: Current   Smokeless tobacco: Never  Vaping Use   Vaping status: Every Day  Substance Use Topics   Alcohol use: Yes    Comment: occ   Drug use: Yes    Types: Marijuana     Loreda Myla SAUNDERS, NP 12/16/24 1529  "

## 2024-12-16 NOTE — ED Triage Notes (Signed)
 Pt present with c/o dental pain x 1 week. Pt states her wisdom tooth is coming in and causing pain to the lt side of the face. Pt states she took Ibuprofen  for relief. C/o pain when opening her mouth.  Pt states she plans to call her Dentist.

## 2024-12-16 NOTE — Discharge Instructions (Addendum)
 Start Augmentin antibiotic twice daily for 7 days.  You also may take naproxen twice daily as needed for pain.  You may use over-the-counter Tylenol  if needed as well.  Salt water  gargles frequently.  Follow-up with your dentist as soon as possible for further evaluation and treatment.  Please go to the emergency room if you develop any worsening symptoms such as fever or facial swelling.  Hope you feel better soon!
